# Patient Record
Sex: Male | Born: 1970 | Hispanic: No | Marital: Single | Smoking: Never smoker
Health system: Southern US, Community
[De-identification: ages and names within clinical notes are randomized; demographics above are authoritative.]

## PROBLEM LIST (undated history)

## (undated) DIAGNOSIS — E119 Type 2 diabetes mellitus without complications: Secondary | ICD-10-CM

## (undated) DIAGNOSIS — E785 Hyperlipidemia, unspecified: Secondary | ICD-10-CM

## (undated) HISTORY — DX: Hyperlipidemia, unspecified: E78.5

---

## 1998-04-02 ENCOUNTER — Encounter: Admission: RE | Admit: 1998-04-02 | Discharge: 1998-07-01 | Payer: Self-pay

## 2013-09-10 ENCOUNTER — Ambulatory Visit (INDEPENDENT_AMBULATORY_CARE_PROVIDER_SITE_OTHER): Payer: PRIVATE HEALTH INSURANCE | Admitting: Family Medicine

## 2013-09-10 ENCOUNTER — Encounter: Payer: Self-pay | Admitting: Family Medicine

## 2013-09-10 ENCOUNTER — Encounter (INDEPENDENT_AMBULATORY_CARE_PROVIDER_SITE_OTHER): Payer: Self-pay

## 2013-09-10 VITALS — BP 123/74 | HR 66 | Temp 98.4°F | Ht 65.5 in | Wt 151.8 lb

## 2013-09-10 DIAGNOSIS — Z0000 Encounter for general adult medical examination without abnormal findings: Secondary | ICD-10-CM

## 2013-09-10 LAB — POCT CBC
Granulocyte percent: 57.4 %G (ref 37–80)
HCT, POC: 43.7 % (ref 43.5–53.7)
Hemoglobin: 14.3 g/dL (ref 14.1–18.1)
Lymph, poc: 2.3 (ref 0.6–3.4)
MCH, POC: 27 pg (ref 27–31.2)
MCHC: 32.7 g/dL (ref 31.8–35.4)
MCV: 82.6 fL (ref 80–97)
MPV: 7.4 fL (ref 0–99.8)
POC Granulocyte: 3.3 (ref 2–6.9)
POC LYMPH PERCENT: 39.9 %L (ref 10–50)
Platelet Count, POC: 260 10*3/uL (ref 142–424)
RBC: 5.3 M/uL (ref 4.69–6.13)
RDW, POC: 13.8 %
WBC: 5.7 10*3/uL (ref 4.6–10.2)

## 2013-09-10 NOTE — Progress Notes (Signed)
   Subjective:    Patient ID: Raden Byington, male    DOB: 1970-10-14, 42 y.o.   MRN: [id number]  HPI This 42 y.o. male presents for evaluation of CPE.  He has no acute complaints.   Review of Systems    No chest pain, SOB, HA, dizziness, vision change, N/V, diarrhea, constipation, dysuria, urinary urgency or frequency, myalgias, arthralgias or rash.  Objective:   Physical Exam  Vital signs noted  Well developed well nourished male.  HEENT - Head atraumatic Normocephalic                Eyes - PERRLA, Conjuctiva - clear Sclera- Clear EOMI                Ears - EAC's Wnl TM's Wnl Gross Hearing WNL                Nose - Nares patent                 Throat - oropharanx wnl Respiratory - Lungs CTA bilateral Cardiac - RRR S1 and S2 without murmur GI - Abdomen soft Nontender and bowel sounds active x 4 Extremities - No edema. Neuro - Grossly intact.      Assessment & Plan:  Routine general medical examination at a health care facility - Plan: POCT CBC, CMP14+EGFR, Lipid panel, Thyroid Panel With TSH, PSA, total and free  Deatra Canter FNP

## 2013-09-11 LAB — PSA, TOTAL AND FREE
PSA, Free Pct: 25 %
PSA, Free: 0.2 ng/mL
PSA: 0.8 ng/mL (ref 0.0–4.0)

## 2013-09-11 LAB — CMP14+EGFR
ALT: 26 IU/L (ref 0–44)
AST: 29 IU/L (ref 0–40)
Albumin/Globulin Ratio: 1.9 (ref 1.1–2.5)
Albumin: 4.8 g/dL (ref 3.5–5.5)
Alkaline Phosphatase: 84 IU/L (ref 39–117)
BUN/Creatinine Ratio: 13 (ref 9–20)
BUN: 10 mg/dL (ref 6–24)
CO2: 23 mmol/L (ref 18–29)
Calcium: 9.3 mg/dL (ref 8.7–10.2)
Chloride: 100 mmol/L (ref 97–108)
Creatinine, Ser: 0.78 mg/dL (ref 0.76–1.27)
GFR calc Af Amer: 131 mL/min/{1.73_m2} (ref >59)
GFR calc non Af Amer: 113 mL/min/{1.73_m2} (ref >59)
Globulin, Total: 2.5 g/dL (ref 1.5–4.5)
Glucose: 92 mg/dL (ref 65–99)
Potassium: 4.2 mmol/L (ref 3.5–5.2)
Sodium: 142 mmol/L (ref 134–144)
Total Bilirubin: 0.4 mg/dL (ref 0.0–1.2)
Total Protein: 7.3 g/dL (ref 6.0–8.5)

## 2013-09-11 LAB — LIPID PANEL
Chol/HDL Ratio: 5 ratio units (ref 0.0–5.0)
Cholesterol, Total: 231 mg/dL — ABNORMAL HIGH (ref 100–199)
HDL: 46 mg/dL (ref >39)
LDL Calculated: 146 mg/dL — ABNORMAL HIGH (ref 0–99)
Triglycerides: 196 mg/dL — ABNORMAL HIGH (ref 0–149)
VLDL Cholesterol Cal: 39 mg/dL (ref 5–40)

## 2013-09-11 LAB — THYROID PANEL WITH TSH
Free Thyroxine Index: 2 (ref 1.2–4.9)
T3 Uptake Ratio: 28 % (ref 24–39)
T4, Total: 7 ug/dL (ref 4.5–12.0)
TSH: 1.07 u[IU]/mL (ref 0.450–4.500)

## 2013-12-12 ENCOUNTER — Encounter: Payer: Self-pay | Admitting: Family Medicine

## 2013-12-12 ENCOUNTER — Other Ambulatory Visit: Payer: Self-pay | Admitting: Family Medicine

## 2013-12-12 ENCOUNTER — Ambulatory Visit (INDEPENDENT_AMBULATORY_CARE_PROVIDER_SITE_OTHER): Payer: PRIVATE HEALTH INSURANCE | Admitting: Family Medicine

## 2013-12-12 VITALS — BP 124/71 | HR 62 | Temp 97.7°F | Ht 65.5 in | Wt 158.6 lb

## 2013-12-12 DIAGNOSIS — L851 Acquired keratosis [keratoderma] palmaris et plantaris: Secondary | ICD-10-CM

## 2013-12-12 DIAGNOSIS — L909 Atrophic disorder of skin, unspecified: Secondary | ICD-10-CM

## 2013-12-12 DIAGNOSIS — L919 Hypertrophic disorder of the skin, unspecified: Secondary | ICD-10-CM

## 2013-12-12 DIAGNOSIS — L918 Other hypertrophic disorders of the skin: Secondary | ICD-10-CM | POA: Insufficient documentation

## 2013-12-12 DIAGNOSIS — L570 Actinic keratosis: Secondary | ICD-10-CM | POA: Insufficient documentation

## 2013-12-12 NOTE — Progress Notes (Signed)
Patient ID: George SofiaWalter [name] Kane, male   DOB: Jan 31, 1971, 43 y.o.   MRN: [id number] SUBJECTIVE: CC: Chief Complaint  Patient presents with  . Acute Visit    WANTS SPOT CHECKED ON FOREHEAD STATES SAW BILL IN DEC FOR CPX AND SPOT THERE IN DEC BUT GOT BIGGER AFTER HE 'SQUEEZED IT"     HPI: Has a leion that was on the forehead that was treated with liquid N2 and it fell off and it is growing back and bigger,no h/o skin cancer.   No past medical history on file. No past surgical history on file. History   Social History  . Marital Status: Single    Spouse Name: N/A    Number of Children: N/A  . Years of Education: N/A   Occupational History  . Not on file.   Social History Main Topics  . Smoking status: Never Smoker   . Smokeless tobacco: Not on file  . Alcohol Use: No  . Drug Use: No  . Sexual Activity: Not on file   Other Topics Concern  . Not on file   Social History Narrative  . No narrative on file   Family History  Problem Relation Age of Onset  . Cancer Mother     breast  . Healthy Sister   . Healthy Brother    No current outpatient prescriptions on file prior to visit.   No current facility-administered medications on file prior to visit.   Allergies  Allergen Reactions  . Neosporin [Neomycin-Bacitracin Zn-Polymyx] Itching    There is no immunization history on file for this patient. Prior to Admission medications   Not on File     ROS: As above in the HPI. All other systems are stable or negative.  OBJECTIVE: APPEARANCE:  Patient in no acute distress.The patient appeared well nourished and normally developed. Acyanotic. Waist: VITAL SIGNS:BP 124/71  Pulse 62  Temp(Src) 97.7 F (36.5 C) (Oral)  Ht 5' 5.5" (1.664 m)  Wt 158 lb 9.6 oz (71.94 kg)  BMI 25.98 kg/m2   SKIN: warm and  Dry without overt rashes, tattoos and scars basae 2 mm keratotic horn on the center of the forehead.4 mm length. Skin tag in right axilla. Lymph Nodes:  normal.  HEAD and Neck: without JVD, Head and scalp: normal Eyes:No scleral icterus. Fundi normal, eye movements normal. Ears: Auricle normal, canal normal, Tympanic membranes normal, insufflation normal. Nose: normal Throat: normal Neck & thyroid: normal  CHEST & LUNGS: Chest wall: normal Lungs: Clear  CVS: Reveals the PMI to be normally located. Regular rhythm, First and Second Heart sounds are normal,  absence of murmurs, rubs or gallops. Peripheral vasculature: Radial pulses: normal Dorsal pedis pulses: normal Posterior pulses: normal  ABDOMEN:  Appearance: normal Benign, no organomegaly, no masses, no Abdominal Aortic enlargement. No Guarding , no rebound. No Bruits. Bowel sounds: normal  RECTAL: N/A GU: N/A  EXTREMETIES: nonedematous.  MUSCULOSKELETAL:  Spine: normal Joints: intact  NEUROLOGIC: oriented to time,place and person; nonfocal.   Procedure:  Area cleaned and prepped with betadine. 1cc of 2% lidocaine for LA Lesion shave off and sent to Path Silver nitrate used for hemostasis. Excellent response . No complication. bandaid applied  ASSESSMENT: Keratotic lesion  Cutaneous skin tags  PLAN: Wound care. Keep clean and dry for 24 hours before washing face .  Await path results in approximately 1 week. No orders of the defined types were placed in this encounter.   No orders of the defined types were placed  in this encounter.   There are no discontinued medications. Return if symptoms worsen or fail to improve.  Gustav Knueppel P. Modesto Charon, M.D.

## 2013-12-12 NOTE — Addendum Note (Signed)
Addended by: Prescott GumLAND, Anjanette Gilkey M on: 12/12/2013 05:10 PM   Modules accepted: Orders

## 2013-12-12 NOTE — Addendum Note (Signed)
Addended by: Prescott GumLAND, Alydia Gosser M on: 12/12/2013 05:09 PM   Modules accepted: Orders

## 2013-12-16 LAB — PATHOLOGY

## 2014-08-12 ENCOUNTER — Encounter (INDEPENDENT_AMBULATORY_CARE_PROVIDER_SITE_OTHER): Payer: Self-pay

## 2014-08-12 ENCOUNTER — Ambulatory Visit (INDEPENDENT_AMBULATORY_CARE_PROVIDER_SITE_OTHER): Payer: PRIVATE HEALTH INSURANCE | Admitting: Family Medicine

## 2014-08-12 ENCOUNTER — Encounter: Payer: Self-pay | Admitting: Family Medicine

## 2014-08-12 VITALS — BP 120/78 | HR 66 | Temp 99.3°F | Ht 65.5 in | Wt 155.8 lb

## 2014-08-12 DIAGNOSIS — Z0000 Encounter for general adult medical examination without abnormal findings: Secondary | ICD-10-CM | POA: Insufficient documentation

## 2014-08-12 NOTE — Progress Notes (Signed)
   Subjective:    Patient ID: George Kane, male    DOB: [birth year], 43 y.o.   MRN: [id number]  HPI  43 year old male here for annual physical at the request of his insurance carrier. He has no chronic problems and has no complaints or symptoms today.    Review of Systems  Constitutional: Negative.   HENT: Negative.   Eyes: Negative.   Respiratory: Negative.  Negative for shortness of breath.   Cardiovascular: Negative.  Negative for chest pain and leg swelling.  Gastrointestinal: Negative.   Genitourinary: Negative.   Musculoskeletal: Negative.   Skin: Negative.   Neurological: Negative.   Psychiatric/Behavioral: Negative.   All other systems reviewed and are negative.      Objective:   Physical Exam  Constitutional: He is oriented to person, place, and time. He appears well-developed and well-nourished.  HENT:  Head: Normocephalic.  Right Ear: External ear normal.  Left Ear: External ear normal.  Nose: Nose normal.  Mouth/Throat: Oropharynx is clear and moist.  Eyes: Conjunctivae and EOM are normal. Pupils are equal, round, and reactive to light.  Neck: Normal range of motion. Neck supple.  Cardiovascular: Normal rate, regular rhythm, normal heart sounds and intact distal pulses.   Pulmonary/Chest: Effort normal and breath sounds normal.  Abdominal: Soft. Bowel sounds are normal.  Musculoskeletal: Normal range of motion.  Neurological: He is alert and oriented to person, place, and time.  Skin: Skin is warm and dry.  Psychiatric: He has a normal mood and affect. His behavior is normal. Judgment and thought content normal.    BP 120/78 mmHg  Pulse 66  Temp(Src) 99.3 F (37.4 C) (Oral)  Ht 5' 5.5" (1.664 m)  Wt 155 lb 12.8 oz (70.67 kg)  BMI 25.52 kg/m2      Assessment & Plan:  1. Routine general medical examination at a health care facility Frederica KusterStephen M Aiven Kampe MD

## 2015-08-25 ENCOUNTER — Encounter: Payer: Self-pay | Admitting: Family Medicine

## 2015-08-25 ENCOUNTER — Ambulatory Visit (INDEPENDENT_AMBULATORY_CARE_PROVIDER_SITE_OTHER): Payer: BLUE CROSS/BLUE SHIELD | Admitting: Family Medicine

## 2015-08-25 VITALS — BP 125/73 | HR 57 | Temp 97.5°F | Ht 65.5 in | Wt 148.8 lb

## 2015-08-25 DIAGNOSIS — Z0000 Encounter for general adult medical examination without abnormal findings: Secondary | ICD-10-CM | POA: Diagnosis not present

## 2015-08-25 NOTE — Progress Notes (Signed)
   Subjective:    Patient ID: George Kane, male    DOB: June 07, 1971, 44 y.o.   MRN: [id number]  HPI 44 year old gentleman here for a annual physical at the request of his insurance company. He has lost some weight since his last visit 1 year ago by stopping drinking. But he is treated for drinking alcohol for sodas. He says it he notes that he urinates more frequently even at night probably this is related to the caffeine in the sodas but will check for diabetes with lab work. He has no other symptoms or complaints. We did spend some time talking about his native country [place] where I went several weeks ago on a mission trip.  Patient Active Problem List   Diagnosis Date Noted  . Routine general medical examination at a health care facility 08/12/2014  . Keratotic lesion 12/12/2013  . Cutaneous skin tags 12/12/2013   No outpatient encounter prescriptions on file as of 08/25/2015.   No facility-administered encounter medications on file as of 08/25/2015.      Review of Systems  Constitutional: Negative.   HENT: Negative.   Eyes: Negative.   Respiratory: Negative.  Negative for shortness of breath.   Cardiovascular: Negative.  Negative for chest pain and leg swelling.  Gastrointestinal: Negative.   Endocrine: Positive for polyuria.  Genitourinary: Negative.   Musculoskeletal: Negative.   Skin: Negative.   Neurological: Negative.   Psychiatric/Behavioral: Negative.   All other systems reviewed and are negative.      Objective:   Physical Exam  Constitutional: He is oriented to person, place, and time. He appears well-developed and well-nourished.  HENT:  Head: Normocephalic.  Right Ear: External ear normal.  Left Ear: External ear normal.  Nose: Nose normal.  Mouth/Throat: Oropharynx is clear and moist.  Eyes: Conjunctivae and EOM are normal. Pupils are equal, round, and reactive to light.  Neck: Normal range of motion. Neck supple.  Cardiovascular:  Normal rate, regular rhythm, normal heart sounds and intact distal pulses.   Pulmonary/Chest: Effort normal and breath sounds normal.  Abdominal: Soft. Bowel sounds are normal.  Musculoskeletal: Normal range of motion.  Neurological: He is alert and oriented to person, place, and time.  Skin: Skin is warm and dry.  Psychiatric: He has a normal mood and affect. His behavior is normal. Judgment and thought content normal.          Assessment & Plan:  1. Routine general medical examination at a health care facility Exam is normal. With symptom of polyuria will check for diabetes but this may be related to excess caffeine in sodas. - CMP14+EGFR - Lipid panel  Wardell Honour MD

## 2015-08-26 LAB — CMP14+EGFR
A/G RATIO: 1.7 (ref 1.1–2.5)
ALK PHOS: 97 IU/L (ref 39–117)
ALT: 41 IU/L (ref 0–44)
AST: 40 IU/L (ref 0–40)
Albumin: 4.8 g/dL (ref 3.5–5.5)
BILIRUBIN TOTAL: 0.4 mg/dL (ref 0.0–1.2)
BUN / CREAT RATIO: 12 (ref 9–20)
BUN: 11 mg/dL (ref 6–24)
CHLORIDE: 101 mmol/L (ref 97–106)
CO2: 27 mmol/L (ref 18–29)
Calcium: 9.7 mg/dL (ref 8.7–10.2)
Creatinine, Ser: 0.89 mg/dL (ref 0.76–1.27)
GFR calc non Af Amer: 105 mL/min/{1.73_m2} (ref >59)
GFR, EST AFRICAN AMERICAN: 122 mL/min/{1.73_m2} (ref >59)
GLOBULIN, TOTAL: 2.9 g/dL (ref 1.5–4.5)
GLUCOSE: 103 mg/dL — AB (ref 65–99)
Potassium: 4.3 mmol/L (ref 3.5–5.2)
SODIUM: 141 mmol/L (ref 136–144)
Total Protein: 7.7 g/dL (ref 6.0–8.5)

## 2015-08-26 LAB — LIPID PANEL
CHOLESTEROL TOTAL: 247 mg/dL — AB (ref 100–199)
Chol/HDL Ratio: 5.7 ratio units — ABNORMAL HIGH (ref 0.0–5.0)
HDL: 43 mg/dL (ref >39)
LDL Calculated: 152 mg/dL — ABNORMAL HIGH (ref 0–99)
Triglycerides: 262 mg/dL — ABNORMAL HIGH (ref 0–149)
VLDL Cholesterol Cal: 52 mg/dL — ABNORMAL HIGH (ref 5–40)

## 2016-08-31 NOTE — Progress Notes (Signed)
   Subjective:    Patient ID: George Kane, male    DOB: 1971-05-25, 45 y.o.   MRN: [id number]  HPI 45 year old gentleman here for annual physical at the request of his employer. He denies any problems or symptoms and he is generally healthy. Family history is not suggestive of the occurrence of any chronic problems.  Patient Active Problem List   Diagnosis Date Noted  . Routine general medical examination at a health care facility 08/12/2014  . Keratotic lesion 12/12/2013  . Cutaneous skin tags 12/12/2013   No outpatient encounter prescriptions on file as of 09/01/2016.   No facility-administered encounter medications on file as of 09/01/2016.       Review of Systems  Constitutional: Negative.   HENT: Negative.   Eyes: Negative.   Respiratory: Negative.  Negative for shortness of breath.   Cardiovascular: Negative.  Negative for chest pain and leg swelling.  Gastrointestinal: Negative.   Genitourinary: Negative.   Musculoskeletal: Negative.   Skin: Negative.   Neurological: Negative.   Psychiatric/Behavioral: Negative.   All other systems reviewed and are negative.      Objective:   Physical Exam  Constitutional: He is oriented to person, place, and time. He appears well-developed and well-nourished.  HENT:  Head: Normocephalic.  Mouth/Throat: Oropharynx is clear and moist.  Eyes: Pupils are equal, round, and reactive to light.  Neck: Normal range of motion.  Cardiovascular: Normal rate, regular rhythm and normal heart sounds.   Pulmonary/Chest: Effort normal and breath sounds normal.  Abdominal: Soft. There is no tenderness.  Genitourinary: Penis normal.  Neurological: He is alert and oriented to person, place, and time. He has normal reflexes.  Skin: Skin is warm and dry.  Psychiatric: He has a normal mood and affect. His behavior is normal.   BP 123/77   Pulse 61   Temp 97.3 F (36.3 C) (Oral)   Ht 5' 5.5" (1.664 m)   Wt 154 lb 3.2 oz (69.9 kg)   BMI  25.27 kg/m         Assessment & Plan:  1. Routine general medical examination at a health care facility M is normal. Patient has gained 5 pounds in the last year. Cautioned about continued weight gain. - CMP14+EGFR - Lipid panel  Wardell Honour MD

## 2016-09-01 ENCOUNTER — Encounter: Payer: Self-pay | Admitting: Family Medicine

## 2016-09-01 ENCOUNTER — Ambulatory Visit (INDEPENDENT_AMBULATORY_CARE_PROVIDER_SITE_OTHER): Payer: BLUE CROSS/BLUE SHIELD | Admitting: Family Medicine

## 2016-09-01 VITALS — BP 123/77 | HR 61 | Temp 97.3°F | Ht 65.5 in | Wt 154.2 lb

## 2016-09-01 DIAGNOSIS — Z0000 Encounter for general adult medical examination without abnormal findings: Secondary | ICD-10-CM

## 2016-09-02 LAB — CMP14+EGFR
A/G RATIO: 1.6 (ref 1.2–2.2)
ALT: 79 IU/L — ABNORMAL HIGH (ref 0–44)
AST: 53 IU/L — ABNORMAL HIGH (ref 0–40)
Albumin: 4.6 g/dL (ref 3.5–5.5)
Alkaline Phosphatase: 99 IU/L (ref 39–117)
BUN/Creatinine Ratio: 11 (ref 9–20)
BUN: 9 mg/dL (ref 6–24)
Bilirubin Total: 0.3 mg/dL (ref 0.0–1.2)
CO2: 27 mmol/L (ref 18–29)
Calcium: 9.2 mg/dL (ref 8.7–10.2)
Chloride: 98 mmol/L (ref 96–106)
Creatinine, Ser: 0.8 mg/dL (ref 0.76–1.27)
GFR, EST AFRICAN AMERICAN: 126 mL/min/{1.73_m2} (ref >59)
GFR, EST NON AFRICAN AMERICAN: 109 mL/min/{1.73_m2} (ref >59)
GLOBULIN, TOTAL: 2.9 g/dL (ref 1.5–4.5)
Glucose: 86 mg/dL (ref 65–99)
POTASSIUM: 4.7 mmol/L (ref 3.5–5.2)
SODIUM: 142 mmol/L (ref 134–144)
TOTAL PROTEIN: 7.5 g/dL (ref 6.0–8.5)

## 2016-09-02 LAB — LIPID PANEL
CHOL/HDL RATIO: 6 ratio — AB (ref 0.0–5.0)
Cholesterol, Total: 229 mg/dL — ABNORMAL HIGH (ref 100–199)
HDL: 38 mg/dL — ABNORMAL LOW (ref >39)
LDL CALC: 118 mg/dL — AB (ref 0–99)
Triglycerides: 363 mg/dL — ABNORMAL HIGH (ref 0–149)
VLDL Cholesterol Cal: 73 mg/dL — ABNORMAL HIGH (ref 5–40)

## 2017-06-28 ENCOUNTER — Encounter: Payer: Self-pay | Admitting: Family Medicine

## 2017-06-28 ENCOUNTER — Ambulatory Visit (INDEPENDENT_AMBULATORY_CARE_PROVIDER_SITE_OTHER): Payer: BLUE CROSS/BLUE SHIELD | Admitting: Family Medicine

## 2017-06-28 ENCOUNTER — Ambulatory Visit: Payer: BLUE CROSS/BLUE SHIELD | Admitting: Family Medicine

## 2017-06-28 ENCOUNTER — Ambulatory Visit (INDEPENDENT_AMBULATORY_CARE_PROVIDER_SITE_OTHER): Payer: BLUE CROSS/BLUE SHIELD

## 2017-06-28 VITALS — BP 126/76 | HR 65 | Temp 97.7°F | Ht 65.5 in | Wt 147.0 lb

## 2017-06-28 DIAGNOSIS — R1032 Left lower quadrant pain: Secondary | ICD-10-CM

## 2017-06-28 DIAGNOSIS — K5900 Constipation, unspecified: Secondary | ICD-10-CM | POA: Diagnosis not present

## 2017-06-28 LAB — URINALYSIS, COMPLETE
BILIRUBIN UA: NEGATIVE
Glucose, UA: NEGATIVE
Ketones, UA: NEGATIVE
Leukocytes, UA: NEGATIVE
NITRITE UA: NEGATIVE
PH UA: 6 (ref 5.0–7.5)
PROTEIN UA: NEGATIVE
Specific Gravity, UA: 1.02 (ref 1.005–1.030)
UUROB: 0.2 mg/dL (ref 0.2–1.0)

## 2017-06-28 LAB — MICROSCOPIC EXAMINATION
Bacteria, UA: NONE SEEN
Epithelial Cells (non renal): NONE SEEN /hpf (ref 0–10)
RENAL EPITHEL UA: NONE SEEN /HPF
WBC UA: NONE SEEN /HPF (ref 0–?)

## 2017-06-28 MED ORDER — POLYETHYLENE GLYCOL 3350 17 G PO PACK: 17.0000 g | PACK | Freq: Every day | ORAL | 0 refills | Status: DC

## 2017-06-28 NOTE — Progress Notes (Signed)
BP 126/76   Pulse 65   Temp 97.7 F (36.5 C) (Oral)   Ht 5' 5.5" (1.664 m)   Wt 147 lb (66.7 kg)   BMI 24.09 kg/m    Subjective:    Patient ID: George Kane, male    DOB: 17-Jun-1971, 46 y.o.   MRN: [id number]  HPI: George Kane is a 46 y.o. male presenting on 06/28/2017 for Abdominal Pain (LLQ x 1 week)   HPI Left lower quadrant abdominal pain Patient has been having left lower quadrant abdominal pain that has been going on for the past week. He does work at a job where he [employer] and heavy things frequently but he cannot recall any specific incident where he felt something Pain after lifting something. He denies any constipation or diarrhea or blood in the stool or dysuria or hematuria. He denies any fevers or chills or pain radiating anywhere else. He says it and pointed down in that left lower quadrant and it hurts more superficial in the abdominal wall or close to it. He denies any sick contacts that he knows of or nausea or vomiting. He says it did hurt for a couple nights about 4 days ago when he laid on that side but is better when he lays on the other side. It also hurts when he twists his torso to the right. He thinks the pain may come on worse after eating but he cannot recall specifically what it does come on and off throughout the day.  Relevant past medical, surgical, family and social history reviewed and updated as indicated. Interim medical history since our last visit reviewed. Allergies and medications reviewed and updated.  Review of Systems  Constitutional: Negative for chills and fever.  Eyes: Negative for discharge.  Respiratory: Negative for shortness of breath and wheezing.   Cardiovascular: Negative for chest pain and leg swelling.  Gastrointestinal: Positive for abdominal pain. Negative for constipation, diarrhea, nausea and vomiting.  Genitourinary: Negative for dysuria, flank pain, frequency and hematuria.  Musculoskeletal: Negative for  back pain and gait problem.  Skin: Negative for rash.  All other systems reviewed and are negative.   Per HPI unless specifically indicated above      Objective:    BP 126/76   Pulse 65   Temp 97.7 F (36.5 C) (Oral)   Ht 5' 5.5" (1.664 m)   Wt 147 lb (66.7 kg)   BMI 24.09 kg/m   Wt Readings from Last 3 Encounters:  06/28/17 147 lb (66.7 kg)  09/01/16 154 lb 3.2 oz (69.9 kg)  08/25/15 148 lb 12.8 oz (67.5 kg)    Physical Exam  Constitutional: He is oriented to person, place, and time. He appears well-developed and well-nourished. No distress.  Eyes: Conjunctivae are normal. No scleral icterus.  Cardiovascular: Normal rate, regular rhythm, normal heart sounds and intact distal pulses.   No murmur heard. Pulmonary/Chest: Effort normal and breath sounds normal. No respiratory distress. He has no wheezes. He has no rales.  Abdominal: Soft. Bowel sounds are normal. He exhibits no distension. There is no hepatosplenomegaly. There is tenderness in the left lower quadrant. There is no rigidity, no rebound, no guarding and no CVA tenderness.  Musculoskeletal: Normal range of motion. He exhibits no edema.  Neurological: He is alert and oriented to person, place, and time. Coordination normal.  Skin: Skin is warm and dry. No rash noted. He is not diaphoretic.  Psychiatric: He has a normal mood and affect. His behavior  is normal.  Nursing note and vitals reviewed.   Urinalysis: 0-2 RBCs, trace blood, otherwise normal  KUB: Moderate stool burden, and wait read from radiology    Assessment & Plan:   Problem List Items Addressed This Visit    None    Visit Diagnoses    Left lower quadrant abdominal pain of unknown etiology    -  Primary   Relevant Medications   polyethylene glycol (MIRALAX / GLYCOLAX) packet   Other Relevant Orders   Urinalysis, Complete   DG Abd 1 View   Constipation, unspecified constipation type       Relevant Medications   polyethylene glycol (MIRALAX /  GLYCOLAX) packet       Follow up plan: Return if symptoms worsen or fail to improve.  Counseling provided for all of the vaccine components Orders Placed This Encounter  Procedures  . DG Abd 1 View  . Urinalysis, Complete    Arville Care, MD [facility] 06/28/2017, 10:36 AM

## 2017-07-12 ENCOUNTER — Encounter: Payer: Self-pay | Admitting: Family Medicine

## 2017-07-12 ENCOUNTER — Ambulatory Visit (INDEPENDENT_AMBULATORY_CARE_PROVIDER_SITE_OTHER): Payer: BLUE CROSS/BLUE SHIELD | Admitting: Family Medicine

## 2017-07-12 ENCOUNTER — Encounter: Payer: BLUE CROSS/BLUE SHIELD | Admitting: Family Medicine

## 2017-07-12 VITALS — BP 137/84 | HR 69 | Temp 97.2°F | Ht 65.5 in | Wt 152.6 lb

## 2017-07-12 DIAGNOSIS — Z0000 Encounter for general adult medical examination without abnormal findings: Secondary | ICD-10-CM

## 2017-07-12 NOTE — Progress Notes (Signed)
   HPI  Patient presents today for annual physical exam.  He was seen a few weeks ago for left lower quadrant pain which was felt to be constipation possibly nephrolithiasis. We have reviewed imaging today and he understands that he does likely have a kidney stone, however it may not have been the issue. He states that his pain has almost completely resolved he has occasional left lower quadrant/left groin pain but states that it's much better.  He did improve after using laxatives.  Patient came in for fasting labs this morning.  He exercises by walking frequently. He does not watch his diet. He has no major concerns today  PMH: Smoking status noted ROS: Per HPI  Objective: BP 137/84   Pulse 69   Temp (!) 97.2 F (36.2 C) (Oral)   Ht 5' 5.5" (1.664 m)   Wt 152 lb 9.6 oz (69.2 kg)   BMI 25.01 kg/m  Gen: NAD, alert, cooperative with exam HEENT: NCAT, PERRLA, TMs normal bilaterally, oropharynx clear, nares with swollen turbinates bilaterally CV: RRR, good S1/S2, no murmur Resp: CTABL, no wheezes, non-labored Abd: SNTND, BS present, no guarding or organomegaly Ext: No edema, warm Neuro: Alert and oriented, No gross deficits  Assessment and plan:  # New physical exam Normal exam, BMI slightly up, however barely over 25. Labs, patient left fasting labs earlier. Discussed nephrolithiasis, offered urology referral, he will wait for now    Orders Placed This Encounter  Procedures  . CMP14+EGFR  . CBC with Differential/Platelet  . Lipid panel  . TSH     Laroy Apple, MD [facility] 07/12/2017, 2:36 PM

## 2017-07-12 NOTE — Patient Instructions (Signed)
Great to meet you!  You do have at least a single kidney stone on the L side, this measures 3mm X 6 mm.    Health Maintenance, Male A healthy lifestyle and preventive care is important for your health and wellness. Ask your health care provider about what schedule of regular examinations is right for you. What should I know about weight and diet? Eat a Healthy Diet  Eat plenty of vegetables, fruits, whole grains, low-fat dairy products, and lean protein.  Do not eat a lot of foods high in solid fats, added sugars, or salt.  Maintain a Healthy Weight Regular exercise can help you achieve or maintain a healthy weight. You should:  Do at least 150 minutes of exercise each week. The exercise should increase your heart rate and make you sweat (moderate-intensity exercise).  Do strength-training exercises at least twice a week.  Watch Your Levels of Cholesterol and Blood Lipids  Have your blood tested for lipids and cholesterol every 5 years starting at 44 years of age. If you are at high risk for heart disease, you should start having your blood tested when you are 44 years old. You may need to have your cholesterol levels checked more often if: ? Your lipid or cholesterol levels are high. ? You are older than 44 years of age. ? You are at high risk for heart disease.  What should I know about cancer screening? Many types of cancers can be detected early and may often be prevented. Lung Cancer  You should be screened every year for lung cancer if: ? You are a current smoker who has smoked for at least 30 years. ? You are a former smoker who has quit within the past 15 years.  Talk to your health care provider about your screening options, when you should start screening, and how often you should be screened.  Colorectal Cancer  Routine colorectal cancer screening usually begins at 44 years of age and should be repeated every 5-10 years until you are 44 years old. You may need to be  screened more often if early forms of precancerous polyps or small growths are found. Your health care provider may recommend screening at an earlier age if you have risk factors for colon cancer.  Your health care provider may recommend using home test kits to check for hidden blood in the stool.  A small camera at the end of a tube can be used to examine your colon (sigmoidoscopy or colonoscopy). This checks for the earliest forms of colorectal cancer.  Prostate and Testicular Cancer  Depending on your age and overall health, your health care provider may do certain tests to screen for prostate and testicular cancer.  Talk to your health care provider about any symptoms or concerns you have about testicular or prostate cancer.  Skin Cancer  Check your skin from head to toe regularly.  Tell your health care provider about any new moles or changes in moles, especially if: ? There is a change in a mole's size, shape, or color. ? You have a mole that is larger than a pencil eraser.  Always use sunscreen. Apply sunscreen liberally and repeat throughout the day.  Protect yourself by wearing long sleeves, pants, a wide-brimmed hat, and sunglasses when outside.  What should I know about heart disease, diabetes, and high blood pressure?  If you are 4218-44 years of age, have your blood pressure checked every 3-5 years. If you are 440 years of age or  older, have your blood pressure checked every year. You should have your blood pressure measured twice-once when you are at a hospital or clinic, and once when you are not at a hospital or clinic. Record the average of the two measurements. To check your blood pressure when you are not at a hospital or clinic, you can use: ? An automated blood pressure machine at a pharmacy. ? A home blood pressure monitor.  Talk to your health care provider about your target blood pressure.  If you are between 76-72 years old, ask your health care provider if you  should take aspirin to prevent heart disease.  Have regular diabetes screenings by checking your fasting blood sugar level. ? If you are at a normal weight and have a low risk for diabetes, have this test once every three years after the age of 95. ? If you are overweight and have a high risk for diabetes, consider being tested at a younger age or more often.  A one-time screening for abdominal aortic aneurysm (AAA) by ultrasound is recommended for men aged 65-75 years who are current or former smokers. What should I know about preventing infection? Hepatitis B If you have a higher risk for hepatitis B, you should be screened for this virus. Talk with your health care provider to find out if you are at risk for hepatitis B infection. Hepatitis C Blood testing is recommended for:  Everyone born from [birth year] through 1963.  Anyone with known risk factors for hepatitis C.  Sexually Transmitted Diseases (STDs)  You should be screened each year for STDs including gonorrhea and chlamydia if: ? You are sexually active and are younger than 44 years of age. ? You are older than 44 years of age and your health care provider tells you that you are at risk for this type of infection. ? Your sexual activity has changed since you were last screened and you are at an increased risk for chlamydia or gonorrhea. Ask your health care provider if you are at risk.  Talk with your health care provider about whether you are at high risk of being infected with HIV. Your health care provider may recommend a prescription medicine to help prevent HIV infection.  What else can I do?  Schedule regular health, dental, and eye exams.  Stay current with your vaccines (immunizations).  Do not use any tobacco products, such as cigarettes, chewing tobacco, and e-cigarettes. If you need help quitting, ask your health care provider.  Limit alcohol intake to no more than 2 drinks per day. One drink equals 12 ounces of beer,  5 ounces of wine, or 1 ounces of hard liquor.  Do not use street drugs.  Do not share needles.  Ask your health care provider for help if you need support or information about quitting drugs.  Tell your health care provider if you often feel depressed.  Tell your health care provider if you have ever been abused or do not feel safe at home. This information is not intended to replace advice given to you by your health care provider. Make sure you discuss any questions you have with your health care provider. Document Released: 03/09/2008 Document Revised: 05/10/2016 Document Reviewed: 06/15/2015 Elsevier Interactive Patient Education  [employer].

## 2017-07-13 ENCOUNTER — Other Ambulatory Visit: Payer: Self-pay | Admitting: Family Medicine

## 2017-07-13 LAB — CMP14+EGFR
A/G RATIO: 1.6 (ref 1.2–2.2)
ALBUMIN: 4.7 g/dL (ref 3.5–5.5)
ALT: 35 IU/L (ref 0–44)
AST: 30 IU/L (ref 0–40)
Alkaline Phosphatase: 91 IU/L (ref 39–117)
BILIRUBIN TOTAL: 0.2 mg/dL (ref 0.0–1.2)
BUN / CREAT RATIO: 15 (ref 9–20)
BUN: 13 mg/dL (ref 6–24)
CALCIUM: 9.5 mg/dL (ref 8.7–10.2)
CHLORIDE: 100 mmol/L (ref 96–106)
CO2: 25 mmol/L (ref 20–29)
Creatinine, Ser: 0.84 mg/dL (ref 0.76–1.27)
GFR, EST AFRICAN AMERICAN: 124 mL/min/{1.73_m2} (ref >59)
GFR, EST NON AFRICAN AMERICAN: 107 mL/min/{1.73_m2} (ref >59)
GLOBULIN, TOTAL: 3 g/dL (ref 1.5–4.5)
Glucose: 97 mg/dL (ref 65–99)
POTASSIUM: 4 mmol/L (ref 3.5–5.2)
SODIUM: 144 mmol/L (ref 134–144)
TOTAL PROTEIN: 7.7 g/dL (ref 6.0–8.5)

## 2017-07-13 LAB — LIPID PANEL
Chol/HDL Ratio: 5.1 ratio — ABNORMAL HIGH (ref 0.0–5.0)
Cholesterol, Total: 235 mg/dL — ABNORMAL HIGH (ref 100–199)
HDL: 46 mg/dL (ref >39)
TRIGLYCERIDES: 429 mg/dL — AB (ref 0–149)

## 2017-07-13 LAB — CBC WITH DIFFERENTIAL/PLATELET
BASOS: 0 %
Basophils Absolute: 0 10*3/uL (ref 0.0–0.2)
EOS (ABSOLUTE): 0.1 10*3/uL (ref 0.0–0.4)
Eos: 2 %
Hematocrit: 44.1 % (ref 37.5–51.0)
Hemoglobin: 15.2 g/dL (ref 13.0–17.7)
IMMATURE GRANS (ABS): 0 10*3/uL (ref 0.0–0.1)
IMMATURE GRANULOCYTES: 1 %
LYMPHS ABS: 2.7 10*3/uL (ref 0.7–3.1)
LYMPHS: 43 %
MCH: 28.2 pg (ref 26.6–33.0)
MCHC: 34.5 g/dL (ref 31.5–35.7)
MCV: 82 fL (ref 79–97)
Monocytes Absolute: 0.5 10*3/uL (ref 0.1–0.9)
Monocytes: 8 %
NEUTROS ABS: 3.1 10*3/uL (ref 1.4–7.0)
Neutrophils: 46 %
PLATELETS: 279 10*3/uL (ref 150–379)
RBC: 5.39 x10E6/uL (ref 4.14–5.80)
RDW: 15 % (ref 12.3–15.4)
WBC: 6.5 10*3/uL (ref 3.4–10.8)

## 2017-07-13 LAB — TSH: TSH: 1.64 u[IU]/mL (ref 0.450–4.500)

## 2017-07-13 MED ORDER — ICOSAPENT ETHYL 1 G PO CAPS: 2.0000 g | ORAL_CAPSULE | Freq: Two times a day (BID) | ORAL | 3 refills | Status: DC

## 2018-11-18 ENCOUNTER — Telehealth: Payer: Self-pay | Admitting: Family Medicine

## 2018-11-18 NOTE — Telephone Encounter (Signed)
Patient aware that he will need to be fasting for blood work

## 2018-12-06 ENCOUNTER — Ambulatory Visit (INDEPENDENT_AMBULATORY_CARE_PROVIDER_SITE_OTHER): Payer: Managed Care, Other (non HMO) | Admitting: Family Medicine

## 2018-12-06 ENCOUNTER — Encounter: Payer: Self-pay | Admitting: Family Medicine

## 2018-12-06 ENCOUNTER — Other Ambulatory Visit: Payer: Self-pay

## 2018-12-06 VITALS — BP 128/76 | HR 75 | Temp 98.1°F | Ht 65.5 in | Wt 158.2 lb

## 2018-12-06 DIAGNOSIS — L918 Other hypertrophic disorders of the skin: Secondary | ICD-10-CM

## 2018-12-06 DIAGNOSIS — M7731 Calcaneal spur, right foot: Secondary | ICD-10-CM | POA: Diagnosis not present

## 2018-12-06 DIAGNOSIS — Z0001 Encounter for general adult medical examination with abnormal findings: Secondary | ICD-10-CM

## 2018-12-06 DIAGNOSIS — L708 Other acne: Secondary | ICD-10-CM | POA: Diagnosis not present

## 2018-12-06 DIAGNOSIS — M778 Other enthesopathies, not elsewhere classified: Secondary | ICD-10-CM | POA: Diagnosis not present

## 2018-12-06 DIAGNOSIS — Z0000 Encounter for general adult medical examination without abnormal findings: Secondary | ICD-10-CM

## 2018-12-06 MED ORDER — DICLOFENAC SODIUM 75 MG PO TBEC: 75.0000 mg | DELAYED_RELEASE_TABLET | Freq: Two times a day (BID) | ORAL | 2 refills | Status: DC

## 2018-12-06 NOTE — Patient Instructions (Signed)
Use heel cups for support of feet to prevent pain.  Back brush in the shower

## 2018-12-06 NOTE — Progress Notes (Signed)
Subjective:  Patient ID: George Kane, male    DOB: 12-26-1970  Age: 48 y.o. MRN: [id number]  CC: Annual Exam   HPI Granvel Poch presents for annual exam.  Depression screen [facility] 2/9 12/06/2018 07/12/2017 06/28/2017  Decreased Interest 0 0 0  Down, Depressed, Hopeless 0 0 0  PHQ - 2 Score 0 0 0    History Rahil has no past medical history on file.   He has no past surgical history on file.   His family history includes Cancer in his mother; Healthy in his brother and sister.He reports that he has never smoked. He has never used smokeless tobacco. He reports that he does not drink alcohol or use drugs.    ROS Review of Systems  Constitutional: Negative for activity change, fatigue and unexpected weight change.  HENT: Negative for congestion, ear pain, hearing loss, postnasal drip and trouble swallowing.   Eyes: Negative for pain and visual disturbance.  Respiratory: Negative for cough, chest tightness and shortness of breath.   Cardiovascular: Negative for chest pain, palpitations and leg swelling.  Gastrointestinal: Negative for abdominal distention, abdominal pain, blood in stool, constipation, diarrhea, nausea and vomiting.  Endocrine: Negative for cold intolerance, heat intolerance and polydipsia.  Genitourinary: Negative for difficulty urinating, dysuria, flank pain, frequency and urgency.  Musculoskeletal: Positive for arthralgias (right elbow, right heel). Negative for joint swelling.  Skin: Negative for color change, rash and wound.  Neurological: Negative for dizziness, syncope, speech difficulty, weakness, light-headedness, numbness and headaches.  Hematological: Does not bruise/bleed easily.  Psychiatric/Behavioral: Negative for confusion, decreased concentration, dysphoric mood and sleep disturbance. The patient is not nervous/anxious.     Objective:  BP 128/76   Pulse 75   Temp 98.1 F (36.7 C) (Oral)   Ht 5' 5.5" (1.664 m)   Wt 158 lb 4 oz  (71.8 kg)   BMI 25.93 kg/m   BP Readings from Last 3 Encounters:  12/06/18 128/76  07/12/17 137/84  06/28/17 126/76    Wt Readings from Last 3 Encounters:  12/06/18 158 lb 4 oz (71.8 kg)  07/12/17 152 lb 9.6 oz (69.2 kg)  06/28/17 147 lb (66.7 kg)     Physical Exam Constitutional:      Appearance: He is well-developed.  HENT:     Head: Normocephalic and atraumatic.  Eyes:     Pupils: Pupils are equal, round, and reactive to light.  Neck:     Musculoskeletal: Normal range of motion.     Thyroid: No thyromegaly.     Trachea: No tracheal deviation.  Cardiovascular:     Rate and Rhythm: Normal rate and regular rhythm.     Heart sounds: Normal heart sounds. No murmur. No friction rub. No gallop.   Pulmonary:     Breath sounds: Normal breath sounds. No wheezing or rales.  Abdominal:     General: Bowel sounds are normal. There is no distension.     Palpations: Abdomen is soft. There is no mass.     Tenderness: There is no abdominal tenderness.     Hernia: There is no hernia in the right inguinal area or left inguinal area.  Genitourinary:    Penis: Normal.      Scrotum/Testes: Normal.  Musculoskeletal: Normal range of motion.        General: Tenderness (rightplantar calcaneus for deep palpation. Right olecranon insertion of triceps) present.  Lymphadenopathy:     Cervical: No cervical adenopathy.  Skin:    General: Skin is warm  and dry.     Findings: Lesion (moderate acne of the upper back) present.  Neurological:     Mental Status: He is alert and oriented to person, place, and time.       Assessment & Plan:   Olegario was seen today for annual exam.  Diagnoses and all orders for this visit:  Annual physical exam  Tendonitis of elbow, right  Heel spur, right  Other acne  Skin tag  Other orders -     diclofenac (VOLTAREN) 75 MG EC tablet; Take 1 tablet (75 mg total) by mouth 2 (two) times daily. For muscle and  Joint pain       I have discontinued  Abie Gustafsson. [name] "[name]"'s Icosapent Ethyl. I am also having him start on diclofenac.  Allergies as of 12/06/2018      Reactions   Neosporin [neomycin-bacitracin Zn-polymyx] Itching      Medication List       Accurate as of December 06, 2018  3:11 PM. Always use your most recent med list.        diclofenac 75 MG EC tablet Commonly known as:  VOLTAREN Take 1 tablet (75 mg total) by mouth 2 (two) times daily. For muscle and  Joint pain        Follow-up: Return in about 1 year (around 12/06/2019).  [name], M.D.

## 2018-12-06 NOTE — Addendum Note (Signed)
Addended by: Margurite Auerbach on: 12/06/2018 03:16 PM   Modules accepted: Orders

## 2018-12-07 ENCOUNTER — Other Ambulatory Visit: Payer: Managed Care, Other (non HMO)

## 2018-12-07 DIAGNOSIS — Z0000 Encounter for general adult medical examination without abnormal findings: Secondary | ICD-10-CM

## 2018-12-07 LAB — URINALYSIS
Bilirubin, UA: NEGATIVE
Glucose, UA: NEGATIVE
Ketones, UA: NEGATIVE
Leukocytes, UA: NEGATIVE
Nitrite, UA: NEGATIVE
PROTEIN UA: NEGATIVE
RBC, UA: NEGATIVE
Specific Gravity, UA: >1.03 — ABNORMAL HIGH (ref 1.005–1.030)
Urobilinogen, Ur: 0.2 mg/dL (ref 0.2–1.0)
pH, UA: 6 (ref 5.0–7.5)

## 2018-12-08 LAB — LIPID PANEL
CHOLESTEROL TOTAL: 233 mg/dL — AB (ref 100–199)
Chol/HDL Ratio: 6.9 ratio — ABNORMAL HIGH (ref 0.0–5.0)
HDL: 34 mg/dL — ABNORMAL LOW (ref >39)
Triglycerides: 501 mg/dL — ABNORMAL HIGH (ref 0–149)

## 2018-12-08 LAB — CMP14+EGFR
ALT: 55 IU/L — ABNORMAL HIGH (ref 0–44)
AST: 39 IU/L (ref 0–40)
Albumin/Globulin Ratio: 1.6 (ref 1.2–2.2)
Albumin: 4.5 g/dL (ref 4.0–5.0)
Alkaline Phosphatase: 95 IU/L (ref 39–117)
BUN/Creatinine Ratio: 16 (ref 9–20)
BUN: 16 mg/dL (ref 6–24)
Bilirubin Total: 0.3 mg/dL (ref 0.0–1.2)
CO2: 25 mmol/L (ref 20–29)
CREATININE: 0.99 mg/dL (ref 0.76–1.27)
Calcium: 9.6 mg/dL (ref 8.7–10.2)
Chloride: 104 mmol/L (ref 96–106)
GFR calc Af Amer: 106 mL/min/{1.73_m2} (ref >59)
GFR calc non Af Amer: 92 mL/min/{1.73_m2} (ref >59)
GLUCOSE: 124 mg/dL — AB (ref 65–99)
Globulin, Total: 2.9 g/dL (ref 1.5–4.5)
Potassium: 4 mmol/L (ref 3.5–5.2)
Sodium: 144 mmol/L (ref 134–144)
Total Protein: 7.4 g/dL (ref 6.0–8.5)

## 2018-12-08 LAB — CBC WITH DIFFERENTIAL/PLATELET
Basophils Absolute: 0 10*3/uL (ref 0.0–0.2)
Basos: 1 %
EOS (ABSOLUTE): 0.2 10*3/uL (ref 0.0–0.4)
Eos: 3 %
Hematocrit: 44.1 % (ref 37.5–51.0)
Hemoglobin: 15.1 g/dL (ref 13.0–17.7)
Immature Grans (Abs): 0 10*3/uL (ref 0.0–0.1)
Immature Granulocytes: 0 %
LYMPHS ABS: 2.7 10*3/uL (ref 0.7–3.1)
Lymphs: 44 %
MCH: 27.8 pg (ref 26.6–33.0)
MCHC: 34.2 g/dL (ref 31.5–35.7)
MCV: 81 fL (ref 79–97)
MONOS ABS: 0.4 10*3/uL (ref 0.1–0.9)
Monocytes: 7 %
NEUTROS ABS: 2.8 10*3/uL (ref 1.4–7.0)
Neutrophils: 45 %
Platelets: 311 10*3/uL (ref 150–450)
RBC: 5.44 x10E6/uL (ref 4.14–5.80)
RDW: 13.9 % (ref 11.6–15.4)
WBC: 6.2 10*3/uL (ref 3.4–10.8)

## 2018-12-08 LAB — PSA, TOTAL AND FREE
PSA, Free Pct: 20 %
PSA, Free: 0.14 ng/mL
Prostate Specific Ag, Serum: 0.7 ng/mL (ref 0.0–4.0)

## 2018-12-12 ENCOUNTER — Other Ambulatory Visit: Payer: Self-pay

## 2018-12-12 MED ORDER — ATORVASTATIN CALCIUM 40 MG PO TABS: 40.0000 mg | ORAL_TABLET | Freq: Every day | ORAL | 1 refills | Status: DC

## 2019-03-10 ENCOUNTER — Encounter (INDEPENDENT_AMBULATORY_CARE_PROVIDER_SITE_OTHER): Payer: Self-pay

## 2019-09-01 IMAGING — DX DG ABDOMEN 1V
1 series · 1 of 1 positions shown · non-contrast
Comparison: None in PACs

CLINICAL DATA: Left lower quadrant pain.

EXAM:
ABDOMEN - 1 VIEW

[abdomen kub]
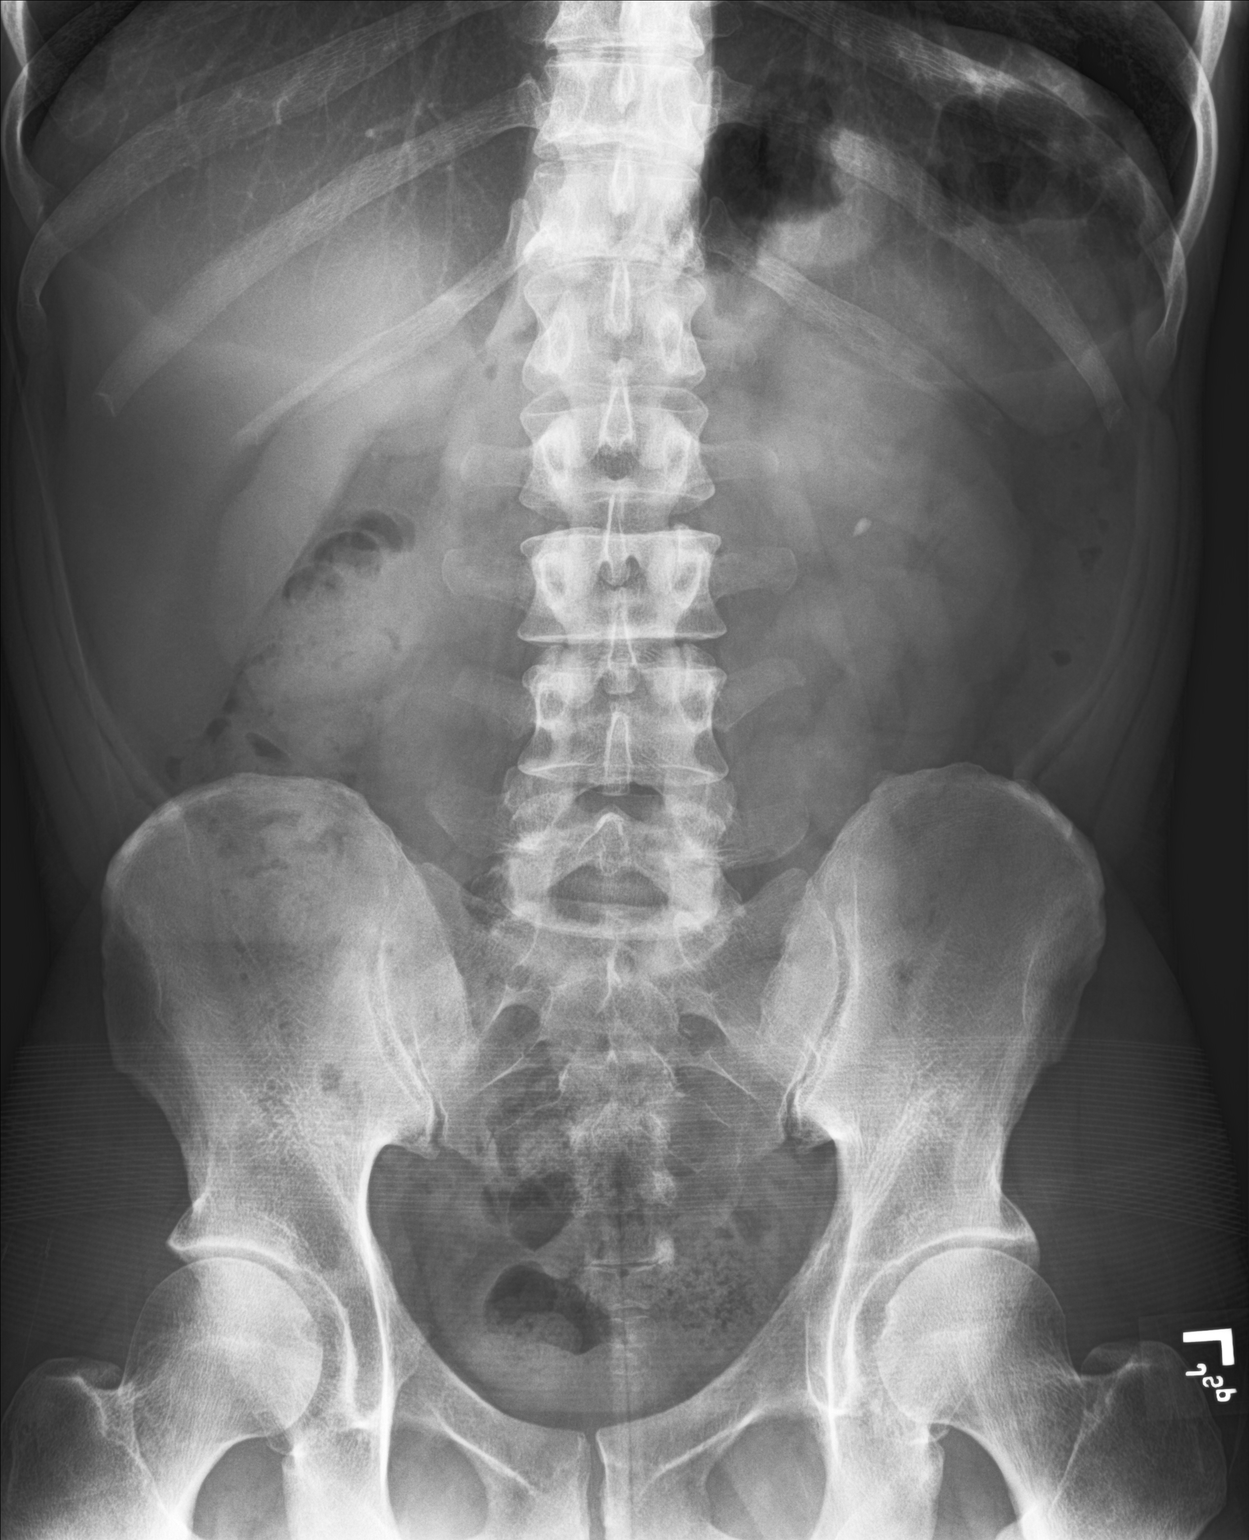

[1 of 1 positions shown; findings below may reference images not displayed]

FINDINGS: There is an approximately 3 x 6 mm stone projecting over the midpole
of the left kidney. There are faint calcifications measuring up 2 mm
in diameter projecting over the upper lower pole of the right
kidney. No definite ureteral stone is observed. There is a sclerotic
focus however just to the left of midline projecting over the lower
sacrum - upper coccyx that may reflect a large distal ureteral
stone.

The bowel gas pattern is normal. The bony structures are
unremarkable.
IMPRESSION: Bilateral kidney stones. Possible distal left ureteral stone but
this may reflect an overlying bony structure. Correlation with
patient's clinical exam and urinalysis is needed.

## 2020-09-29 ENCOUNTER — Other Ambulatory Visit: Payer: Self-pay

## 2020-09-29 ENCOUNTER — Ambulatory Visit (INDEPENDENT_AMBULATORY_CARE_PROVIDER_SITE_OTHER): Payer: 59 | Admitting: Family Medicine

## 2020-09-29 ENCOUNTER — Encounter: Payer: Self-pay | Admitting: Family Medicine

## 2020-09-29 VITALS — BP 134/89 | HR 65 | Temp 97.6°F | Ht 65.5 in | Wt 161.6 lb

## 2020-09-29 DIAGNOSIS — Z0001 Encounter for general adult medical examination with abnormal findings: Secondary | ICD-10-CM | POA: Diagnosis not present

## 2020-09-29 DIAGNOSIS — E782 Mixed hyperlipidemia: Secondary | ICD-10-CM | POA: Diagnosis not present

## 2020-09-29 DIAGNOSIS — Z0000 Encounter for general adult medical examination without abnormal findings: Secondary | ICD-10-CM

## 2020-09-29 DIAGNOSIS — R350 Frequency of micturition: Secondary | ICD-10-CM | POA: Diagnosis not present

## 2020-09-29 MED ORDER — ATORVASTATIN CALCIUM 40 MG PO TABS: 40.0000 mg | ORAL_TABLET | Freq: Every day | ORAL | 1 refills | Status: DC

## 2020-09-29 NOTE — Progress Notes (Signed)
Subjective:  Patient ID: George Kane, male    DOB: [birth year]  Age: 50 y.o. MRN: [id number]  CC: Annual Exam   George Kane presents for complete physical. His only concern is nocturia X 3 most nights. ONgoing for 1 year. Minimal help from George Kane after 7 PM  Depression screen [facility] 2/9 09/29/2020 12/06/2018 07/12/2017  Decreased Interest 0 0 0  Down, Depressed, Hopeless 0 0 0  PHQ - 2 Score 0 0 0    History George Kane has no past medical history on file.   He has no past surgical history on file.   His family history includes Cancer in his mother; Healthy in his brother and sister.He reports that he has never smoked. He has never used smokeless tobacco. He reports that he does not drink alcohol and does not use drugs.    ROS Review of Systems  Constitutional: Negative for activity change, fatigue and unexpected weight change.  HENT: Negative for congestion, ear pain, postnasal drip and trouble swallowing. Hearing loss: recent, both ears.   Eyes: Negative for pain and visual disturbance.  Respiratory: Negative for cough, chest tightness and shortness of breath.   Cardiovascular: Negative for chest pain, palpitations and leg swelling.  Gastrointestinal: Negative for abdominal distention, abdominal pain, blood in stool, constipation, diarrhea, nausea and vomiting.  Endocrine: Negative for cold intolerance, heat intolerance and polydipsia.  Genitourinary: Positive for frequency (nocturia X 3). Negative for decreased urine volume, difficulty urinating, dysuria, flank pain, penile pain, testicular pain and urgency.  Musculoskeletal: Negative for arthralgias and joint swelling.  Skin: Negative for color change, rash and wound.  Neurological: Negative for dizziness, syncope, speech difficulty, weakness, light-headedness, numbness and headaches.  Hematological: Does not bruise/bleed easily.  Psychiatric/Behavioral: Negative for confusion, decreased concentration, dysphoric  mood and sleep disturbance. The patient is not nervous/anxious.     Objective:  BP 134/89   Pulse 65   Temp 97.6 F (36.4 C) (Temporal)   Ht 5' 5.5" (1.664 m)   Wt 161 lb 9.6 oz (73.3 kg)   BMI 26.48 kg/m   BP Readings from Last 3 Encounters:  09/29/20 134/89  12/06/18 128/76  07/12/17 137/84    Wt Readings from Last 3 Encounters:  09/29/20 161 lb 9.6 oz (73.3 kg)  12/06/18 158 lb 4 oz (71.8 kg)  07/12/17 152 lb 9.6 oz (69.2 kg)     Physical Exam Constitutional:      Appearance: He is well-developed and well-nourished.  HENT:     Head: Normocephalic and atraumatic.     Mouth/Throat:     Mouth: Oropharynx is clear and moist.  Eyes:     Extraocular Movements: EOM normal.     Pupils: Pupils are equal, round, and reactive to light.  Neck:     Thyroid: No thyromegaly.     Trachea: No tracheal deviation.  Cardiovascular:     Rate and Rhythm: Normal rate and regular rhythm.     Heart sounds: Normal heart sounds. No murmur heard. No friction rub. No gallop.   Pulmonary:     Breath sounds: Normal breath sounds. No wheezing or rales.  Abdominal:     General: Bowel sounds are normal. There is no distension.     Palpations: Abdomen is soft. There is no mass.     Tenderness: There is no abdominal tenderness.     Hernia: There is no hernia in the right inguinal area or left inguinal area.  Genitourinary:    Penis: Normal.  Testes: Normal.     Prostate: Normal. Not enlarged, not tender and no nodules present.     Rectum: Normal.  Musculoskeletal:        General: No edema. Normal range of motion.     Cervical back: Normal range of motion.  Lymphadenopathy:     Cervical: No cervical adenopathy.  Skin:    General: Skin is warm and dry.  Neurological:     Mental Status: He is alert and oriented to person, place, and time.  Psychiatric:        Mood and Affect: Mood and affect normal.       Assessment & Plan:   George Kane was seen today for annual  exam.  Diagnoses and all orders for this visit:  Well adult exam -     Hepatitis C antibody -     HIV Antibody (routine testing w rflx) -     CBC with Differential/Platelet -     CMP14+EGFR -     Urinalysis -     Urine Culture -     VITAMIN D 25 Hydroxy (Vit-D Deficiency, Fractures) -     PSA, total and free -     Fecal occult blood, imunochemical; Future  Urinary frequency -     Hepatitis C antibody -     HIV Antibody (routine testing w rflx) -     CBC with Differential/Platelet -     CMP14+EGFR -     Urinalysis -     Urine Culture -     VITAMIN D 25 Hydroxy (Vit-D Deficiency, Fractures) -     PSA, total and free -     Fecal occult blood, imunochemical; Future  Mixed hyperlipidemia -     CMP14+EGFR -     Lipid panel  Other orders -     atorvastatin (LIPITOR) 40 MG tablet; Take 1 tablet (40 mg total) by mouth daily.       I have discontinued George Kane. George Kane "[name]"'s diclofenac. I am also having him maintain his atorvastatin.  Allergies as of 09/29/2020      Reactions   Neosporin [neomycin-bacitracin Zn-polymyx] Itching      Medication List       Accurate as of September 29, 2020 10:05 AM. If you have any questions, ask your nurse or doctor.        STOP taking these medications   diclofenac 75 MG EC tablet Commonly known as: VOLTAREN Stopped by: George Fraise, MD     TAKE these medications   atorvastatin 40 MG tablet Commonly known as: LIPITOR Take 1 tablet (40 mg total) by mouth daily.        Follow-up: Return in about 1 month (around 10/30/2020) for urinary frequency.  George Kane, M.D.

## 2020-09-29 NOTE — Addendum Note (Signed)
Addended by: Margorie John on: 09/29/2020 10:21 AM   Modules accepted: Orders

## 2020-09-30 ENCOUNTER — Other Ambulatory Visit: Payer: Self-pay | Admitting: Family Medicine

## 2020-09-30 LAB — HIV ANTIBODY (ROUTINE TESTING W REFLEX): HIV Screen 4th Generation wRfx: NONREACTIVE

## 2020-09-30 LAB — CMP14+EGFR
ALT: 100 IU/L — ABNORMAL HIGH (ref 0–44)
AST: 81 IU/L — ABNORMAL HIGH (ref 0–40)
Albumin/Globulin Ratio: 1.5 (ref 1.2–2.2)
Albumin: 4.7 g/dL (ref 4.0–5.0)
Alkaline Phosphatase: 116 IU/L (ref 44–121)
BUN/Creatinine Ratio: 12 (ref 9–20)
BUN: 10 mg/dL (ref 6–24)
Bilirubin Total: 0.5 mg/dL (ref 0.0–1.2)
CO2: 23 mmol/L (ref 20–29)
Calcium: 9.8 mg/dL (ref 8.7–10.2)
Chloride: 97 mmol/L (ref 96–106)
Creatinine, Ser: 0.85 mg/dL (ref 0.76–1.27)
GFR calc Af Amer: 120 mL/min/{1.73_m2} (ref >59)
GFR calc non Af Amer: 104 mL/min/{1.73_m2} (ref >59)
Globulin, Total: 3.1 g/dL (ref 1.5–4.5)
Glucose: 181 mg/dL — ABNORMAL HIGH (ref 65–99)
Potassium: 4 mmol/L (ref 3.5–5.2)
Sodium: 138 mmol/L (ref 134–144)
Total Protein: 7.8 g/dL (ref 6.0–8.5)

## 2020-09-30 LAB — LIPID PANEL
Chol/HDL Ratio: 7 ratio — ABNORMAL HIGH (ref 0.0–5.0)
Cholesterol, Total: 244 mg/dL — ABNORMAL HIGH (ref 100–199)
HDL: 35 mg/dL — ABNORMAL LOW (ref >39)
LDL Chol Calc (NIH): 118 mg/dL — ABNORMAL HIGH (ref 0–99)
Triglycerides: 512 mg/dL — ABNORMAL HIGH (ref 0–149)
VLDL Cholesterol Cal: 91 mg/dL — ABNORMAL HIGH (ref 5–40)

## 2020-09-30 LAB — CBC WITH DIFFERENTIAL/PLATELET
Basophils Absolute: 0 10*3/uL (ref 0.0–0.2)
Basos: 0 %
EOS (ABSOLUTE): 0.1 10*3/uL (ref 0.0–0.4)
Eos: 2 %
Hematocrit: 47.3 % (ref 37.5–51.0)
Hemoglobin: 15.7 g/dL (ref 13.0–17.7)
Immature Grans (Abs): 0 10*3/uL (ref 0.0–0.1)
Immature Granulocytes: 0 %
Lymphocytes Absolute: 1.9 10*3/uL (ref 0.7–3.1)
Lymphs: 39 %
MCH: 26.8 pg (ref 26.6–33.0)
MCHC: 33.2 g/dL (ref 31.5–35.7)
MCV: 81 fL (ref 79–97)
Monocytes Absolute: 0.4 10*3/uL (ref 0.1–0.9)
Monocytes: 8 %
Neutrophils Absolute: 2.5 10*3/uL (ref 1.4–7.0)
Neutrophils: 51 %
Platelets: 288 10*3/uL (ref 150–450)
RBC: 5.86 x10E6/uL — ABNORMAL HIGH (ref 4.14–5.80)
RDW: 13.4 % (ref 11.6–15.4)
WBC: 4.8 10*3/uL (ref 3.4–10.8)

## 2020-09-30 LAB — HEPATITIS C ANTIBODY: Hep C Virus Ab: <0.1 s/co ratio (ref 0.0–0.9)

## 2020-09-30 LAB — PSA, TOTAL AND FREE
PSA, Free Pct: 38.6 %
PSA, Free: 0.27 ng/mL
Prostate Specific Ag, Serum: 0.7 ng/mL (ref 0.0–4.0)

## 2020-09-30 LAB — VITAMIN D 25 HYDROXY (VIT D DEFICIENCY, FRACTURES): Vit D, 25-Hydroxy: 12.3 ng/mL — ABNORMAL LOW (ref 30.0–100.0)

## 2020-09-30 LAB — FECAL OCCULT BLOOD, IMMUNOCHEMICAL: Fecal Occult Bld: NEGATIVE

## 2020-09-30 MED ORDER — VITAMIN D (ERGOCALCIFEROL) 1.25 MG (50000 UNIT) PO CAPS: 50000.0000 [IU] | ORAL_CAPSULE | ORAL | 3 refills | Status: DC

## 2020-10-04 ENCOUNTER — Telehealth: Payer: Self-pay | Admitting: *Deleted

## 2020-10-04 LAB — SPECIMEN STATUS REPORT

## 2020-10-04 LAB — HGB A1C W/O EAG: Hgb A1c MFr Bld: 9.1 % — ABNORMAL HIGH (ref 4.8–5.6)

## 2020-10-04 NOTE — Telephone Encounter (Signed)
Signed physical insurance form placed at front desk for patient to pick up

## 2020-10-12 ENCOUNTER — Encounter: Payer: Self-pay | Admitting: *Deleted

## 2020-11-01 ENCOUNTER — Other Ambulatory Visit: Payer: Self-pay

## 2020-11-01 ENCOUNTER — Ambulatory Visit (INDEPENDENT_AMBULATORY_CARE_PROVIDER_SITE_OTHER): Payer: 59 | Admitting: Family Medicine

## 2020-11-01 ENCOUNTER — Encounter: Payer: Self-pay | Admitting: Family Medicine

## 2020-11-01 VITALS — BP 132/81 | HR 70 | Temp 98.4°F | Ht 65.5 in | Wt 158.2 lb

## 2020-11-01 DIAGNOSIS — R350 Frequency of micturition: Secondary | ICD-10-CM | POA: Diagnosis not present

## 2020-11-01 DIAGNOSIS — R81 Glycosuria: Secondary | ICD-10-CM

## 2020-11-01 DIAGNOSIS — E782 Mixed hyperlipidemia: Secondary | ICD-10-CM | POA: Diagnosis not present

## 2020-11-01 LAB — URINALYSIS, COMPLETE
Bilirubin, UA: NEGATIVE
Ketones, UA: NEGATIVE
Leukocytes,UA: NEGATIVE
Nitrite, UA: NEGATIVE
RBC, UA: NEGATIVE
Specific Gravity, UA: >1.03 — ABNORMAL HIGH (ref 1.005–1.030)
Urobilinogen, Ur: 0.2 mg/dL (ref 0.2–1.0)
pH, UA: 5 (ref 5.0–7.5)

## 2020-11-01 LAB — MICROSCOPIC EXAMINATION: RBC, Urine: NONE SEEN /hpf (ref 0–2)

## 2020-11-01 LAB — CMP14+EGFR
ALT: 61 IU/L — ABNORMAL HIGH (ref 0–44)
AST: 38 IU/L (ref 0–40)
Albumin/Globulin Ratio: 1.8 (ref 1.2–2.2)
Albumin: 4.9 g/dL (ref 4.0–5.0)
Alkaline Phosphatase: 141 IU/L — ABNORMAL HIGH (ref 44–121)
BUN/Creatinine Ratio: 14 (ref 9–20)
BUN: 13 mg/dL (ref 6–24)
Bilirubin Total: 0.6 mg/dL (ref 0.0–1.2)
CO2: 23 mmol/L (ref 20–29)
Calcium: 9.7 mg/dL (ref 8.7–10.2)
Chloride: 101 mmol/L (ref 96–106)
Creatinine, Ser: 0.92 mg/dL (ref 0.76–1.27)
GFR calc Af Amer: 114 mL/min/{1.73_m2} (ref >59)
GFR calc non Af Amer: 99 mL/min/{1.73_m2} (ref >59)
Globulin, Total: 2.8 g/dL (ref 1.5–4.5)
Glucose: 253 mg/dL — ABNORMAL HIGH (ref 65–99)
Potassium: 3.8 mmol/L (ref 3.5–5.2)
Sodium: 141 mmol/L (ref 134–144)
Total Protein: 7.7 g/dL (ref 6.0–8.5)

## 2020-11-01 LAB — LIPID PANEL
Chol/HDL Ratio: 4.1 ratio (ref 0.0–5.0)
Cholesterol, Total: 159 mg/dL (ref 100–199)
HDL: 39 mg/dL — ABNORMAL LOW (ref >39)
LDL Chol Calc (NIH): 71 mg/dL (ref 0–99)
Triglycerides: 303 mg/dL — ABNORMAL HIGH (ref 0–149)
VLDL Cholesterol Cal: 49 mg/dL — ABNORMAL HIGH (ref 5–40)

## 2020-11-01 NOTE — Progress Notes (Signed)
Subjective:  Patient ID: George Kane, male    DOB: 1971/08/29  Age: 50 y.o. MRN: [id number]  CC: Urinary Frequency   HPI George Kane presents for improved to the point of being in significant currently.  He is reduced his water intake after supper and eliminated all alcohol and the frequency of urination has diminished to nocturia x1 only and not present during the daytime.  Depression screen George Kane 2/9 11/01/2020 09/29/2020 12/06/2018  Decreased Interest 0 0 0  Down, Depressed, Hopeless 0 0 0  PHQ - 2 Score 0 0 0    History George Kane has no past medical history on file.   He has no past surgical history on file.   His family history includes Cancer in his mother; Healthy in his brother and sister.He reports that he has never smoked. He has never used smokeless tobacco. He reports that he does not drink alcohol and does not use drugs.    ROS Review of Systems  Constitutional: Negative for fever.  Respiratory: Negative for shortness of breath.   Cardiovascular: Negative for chest pain.  Musculoskeletal: Negative for arthralgias.  Skin: Negative for rash.    Objective:  BP 132/81   Pulse 70   Temp 98.4 F (36.9 C) (Temporal)   Ht 5' 5.5" (1.664 m)   Wt 158 lb 3.2 oz (71.8 kg)   BMI 25.93 kg/m   BP Readings from Last 3 Encounters:  11/01/20 132/81  09/29/20 134/89  12/06/18 128/76    Wt Readings from Last 3 Encounters:  11/01/20 158 lb 3.2 oz (71.8 kg)  09/29/20 161 lb 9.6 oz (73.3 kg)  12/06/18 158 lb 4 oz (71.8 kg)     Physical Exam Vitals reviewed.  Constitutional:      Appearance: He is well-developed and well-nourished.  HENT:     Head: Normocephalic and atraumatic.     Right Ear: External ear normal.     Left Ear: External ear normal.     Mouth/Throat:     Pharynx: No oropharyngeal exudate or posterior oropharyngeal erythema.  Eyes:     Pupils: Pupils are equal, round, and reactive to light.  Cardiovascular:     Rate and Rhythm: Normal  rate and regular rhythm.     Heart sounds: No murmur heard.   Pulmonary:     Effort: No respiratory distress.     Breath sounds: Normal breath sounds.  Musculoskeletal:     Cervical back: Normal range of motion and neck supple.  Neurological:     Mental Status: He is alert and oriented to person, place, and time.     Results for orders placed or performed in visit on 11/01/20  Microscopic Examination   Urine  Result Value Ref Range   WBC, UA 0-5 0 - 5 /hpf   RBC None seen 0 - 2 /hpf   Epithelial Cells (non renal) 0-10 0 - 10 /hpf   Mucus, UA Present (A) Not Estab.   Bacteria, UA Few None seen/Few  Urinalysis, Complete  Result Value Ref Range   Specific Gravity, UA >1.030 (H) 1.005 - 1.030   pH, UA 5.0 5.0 - 7.5   Color, UA Yellow Yellow   Appearance Ur Clear Clear   Leukocytes,UA Negative Negative   Protein,UA 1+ (A) Negative/Trace   Glucose, UA 3+ (A) Negative   Ketones, UA Negative Negative   RBC, UA Negative Negative   Bilirubin, UA Negative Negative   Urobilinogen, Ur 0.2 0.2 - 1.0 mg/dL  Nitrite, UA Negative Negative   Microscopic Examination See below:      Assessment & Plan:   Caymen was seen today for urinary frequency.  Diagnoses and all orders for this visit:  Mixed hyperlipidemia -     Lipid panel -     CMP14+EGFR  Urinary frequency -     Urine Culture; Future -     Urinalysis, Complete -     Urine Culture -     Microscopic Examination  Glycosuria -     Microscopic Examination    Glycosuria noted on specimen.  A1c will be added.   I am having George Kane. Sherrill Raring" maintain his atorvastatin and Vitamin D (Ergocalciferol).  Allergies as of 11/01/2020      Reactions   Neosporin [neomycin-bacitracin Zn-polymyx] Itching      Medication List       Accurate as of November 01, 2020  9:47 PM. If you have any questions, ask your nurse or doctor.        atorvastatin 40 MG tablet Commonly known as: LIPITOR Take 1 tablet (40 mg total) by  mouth daily.   Vitamin D (Ergocalciferol) 1.25 MG (50000 UNIT) Caps capsule Commonly known as: DRISDOL Take 1 capsule (50,000 Units total) by mouth every 7 (seven) days.        Follow-up: Return in about 6 months (around 05/01/2021), or if symptoms worsen or fail to improve.  Claretta Fraise, M.D.

## 2020-11-02 LAB — URINE CULTURE

## 2020-11-02 LAB — BAYER DCA HB A1C WAIVED: HB A1C (BAYER DCA - WAIVED): 10.3 % — ABNORMAL HIGH (ref <7.0)

## 2020-11-02 NOTE — Addendum Note (Signed)
Addended by: Margorie John on: 11/02/2020 08:07 AM   Modules accepted: Orders

## 2020-11-05 ENCOUNTER — Other Ambulatory Visit: Payer: Self-pay | Admitting: Family Medicine

## 2020-11-05 MED ORDER — METFORMIN HCL ER 500 MG PO TB24: 500.0000 mg | ORAL_TABLET | Freq: Every day | ORAL | 2 refills | Status: DC

## 2020-12-08 ENCOUNTER — Other Ambulatory Visit: Payer: Self-pay

## 2020-12-08 ENCOUNTER — Ambulatory Visit (INDEPENDENT_AMBULATORY_CARE_PROVIDER_SITE_OTHER): Payer: 59 | Admitting: Family Medicine

## 2020-12-08 ENCOUNTER — Encounter: Payer: Self-pay | Admitting: Family Medicine

## 2020-12-08 VITALS — BP 126/65 | HR 64 | Temp 98.0°F | Ht 65.5 in | Wt 152.8 lb

## 2020-12-08 DIAGNOSIS — E119 Type 2 diabetes mellitus without complications: Secondary | ICD-10-CM | POA: Diagnosis not present

## 2020-12-08 MED ORDER — BLOOD GLUCOSE MONITOR KIT: PACK | 11 refills | Status: AC

## 2020-12-08 NOTE — Patient Instructions (Signed)
https://www.diabeteseducator.org/docs/default-source/living-with-diabetes/conquering-the-grocery-store-v1.pdf?sfvrsn=4">  Recuento de carbohidratos para la diabetes mellitus en los adultos Carbohydrate Counting for Diabetes Mellitus, Adult El recuento de carbohidratos es un mtodo para llevar un registro de la cantidad de carbohidratos que se ingieren. La ingesta natural de carbohidratos aumenta la cantidad de azcar (glucosa) en la sangre. El recuento de la cantidad de carbohidratos que se ingieren mejora el control del nivel de [place], lo que ayuda a Company secretary la diabetes. Es importante saber la cantidad de carbohidratos que se pueden ingerir en cada comida sin correr Surveyor, minerals. Esto es Government social research officer. Un nutricionista puede ayudarlo a crear un plan de alimentacin y a calcular la cantidad de carbohidratos que debe ingerir en cada comida y colacin. Qu alimentos contienen carbohidratos? Los siguientes alimentos incluyen carbohidratos:  Granos, como panes y cereales.  Frijoles secos y productos con soja.  Verduras con almidn, como papas, guisantes y maz.  Nils Pyle y jugos de frutas.  Leche y Dentist.  Dulces y colaciones, como pasteles, galletas, caramelos, papas fritas de bolsa y refrescos.   Cmo se calculan los carbohidratos de los alimentos? Hay dos maneras de calcular los carbohidratos de los alimentos. Puede leer las etiquetas de los alimentos o aprender cules son los tamaos de las porciones estndar de los alimentos. Puede usar cualquiera de [address] o [place] combinacin de [place]. Usar la Air cabin crew de informacin nutricional La lista de informacin nutricional est incluida en las etiquetas de casi todas las bebidas y los alimentos envasados de los [place]. Incluye lo siguiente:  El tamao de la porcin.  Informacin sobre los nutrientes de cada porcin, incluidos los gramos (g) de carbohidratos por porcin. Para usar la informacin  nutricional:  Decida cuntas porciones va a comer.  Multiplique la cantidad de porciones por el nmero de carbohidratos por porcin.  El resultado es la cantidad total de carbohidratos que comer. Conocer los tamaos de las porciones estndar de los alimentos Cuando coma alimentos que contengan carbohidratos y que no estn envasados o no incluyan la informacin nutricional en la etiqueta, debe medir las porciones para poder calcular la cantidad de carbohidratos.  Mida los alimentos que comer con una balanza de alimentos o una taza medidora, si es necesario.  Decida cuntas porciones de Programmer, systems.  Multiplique el nmero de porciones por15. En los alimentos que contienen carbohidratos, una porcin [place] a 15 g de carbohidratos. ? Por ejemplo, si come 2 tazas o 10onzas (300g) de fresas, habr comido 2porciones y 30g de carbohidratos (2porciones x 15g=30g).  En el caso de las comidas que contienen mezclas de ms de un alimento, como las sopas y los guisos, debe calcular los carbohidratos de cada alimento que se incluye. La siguiente lista contiene los tamaos de porciones estndar de los alimentos ricos en carbohidratos ms comunes. Cada una de estas porciones tiene aproximadamente 15g de carbohidratos:  1rebanada de pan.  1tortilla de seis pulgadas (15cm).  ? de taza o 2onzas (53g) de arroz o pasta cocidos.   taza o 3 onzas (85 g) de lentejas o frijoles cocidos o enlatados, escurridos y enjuagados.   taza o 3onzas (85g) de verduras con almidn, como guisantes, maz o zapallo.   taza o 4 onzas (120 g) de cereal caliente.   taza o 3 onzas (85 g) de papas hervidas o en pur, o  o 3 onzas (85 g) de una papa grande al horno.   taza o 4 onzas fluidas ( ) de jugo de frutas.  1 taza u 8  onzas fluidas (237 ml) de leche.  1 unidad pequea o 4 onzas (106 g) de manzana.   unidad o 2 onzas (63 g) de una banana mediana.  1 taza o 5 oz (150 g) de  fresas.  3 tazas o 1 oz (24g) de palomitas de maz. Cul sera un ejemplo de recuento de carbohidratos? Para calcular el nmero de carbohidratos de este ejemplo de comida, siga los pasos que se describen a continuacin. Ejemplo de comida  3 onzas (85g) de pechugas de pollo.  ? de taza o 4 onzas (106 g) de arroz integral.   taza o 3 onzas (85 g) de maz.  1 taza u 8 onzas fluidas (237 ml) de leche.  1 taza o 5onzas (150g) de fresas con crema batida sin azcar. Clculo de carbohidratos 1. Identifique los alimentos que contienen carbohidratos: ? Arroz. ? Maz. ? Leche. ? Jinny Sanders. 2. Calcule cuntas porciones come de cada alimento: ? 2 porciones de arroz. ? 1 porcin de maz. ? 1 porcin de leche. ? 1 porcin de fresas. 3. Multiplique cada nmero de porciones por 15g: ? 2 porciones de arroz x 15 g = 30 g. ? 1 porcin de maz x 15 g = 15 g. ? 1 porcin de leche x 15 g = 15 g. ? 1 porcin de fresas x 15 g = 15 g. 4. Sume todas las cantidades para conocer el total de gramos de carbohidratos consumidos: ? 30g + 15g + 15g + 15g = 75g de carbohidratos en total. Consejos para seguir este plan Al ir de compras  [place]ore un plan de comidas y luego haga una lista de compras.  Compre verduras frescas y congeladas, frutas frescas y congeladas, productos lcteos, huevos, frijoles, lentejas y cereales integrales.  Fjese en las etiquetas de los alimentos. Elija los alimentos que tengan ms fibra y Neurosurgeon.  Evite los alimentos procesados y los alimentos con Biochemist, clinical. Planificacin de las comidas  Trate de consumir la misma cantidad de carbohidratos en cada comida y en cada colacin.  Planifique tomar comidas y colaciones regulares y equilibradas. Dnde buscar ms informacin  American Diabetes Association (Asociacin Estadounidense de la Diabetes): www.diabetes.org  Centers for Disease Control and Prevention (Centros para el Control y la Prevencin de  Event organiser): FootballExhibition.com.br Resumen  El recuento de carbohidratos es un mtodo para llevar un registro de la cantidad de carbohidratos que se ingieren.  La ingesta natural de carbohidratos aumenta la cantidad de azcar (glucosa) en la sangre.  El recuento de la cantidad de carbohidratos que se ingieren mejora el control del nivel de [place], lo que ayuda a Company secretary la diabetes.  Un nutricionista puede ayudarlo a crear un plan de alimentacin y a calcular la cantidad de carbohidratos que debe ingerir en cada comida y colacin. Esta informacin no tiene Theme park manager el consejo del mdico. Asegrese de hacerle al mdico cualquier pregunta que tenga. Document Revised: 10/20/2019 Document Reviewed: 10/20/2019 Elsevier Patient Education  2021 Elsevier Inc. Diabetes Mellitus and Exercise Exercising regularly is important for overall health, especially for people who have diabetes mellitus. Exercising is not only about losing weight. It has many other health benefits, such as increasing muscle strength and bone density and reducing body fat and stress. This leads to improved fitness, flexibility, and endurance, all of which result in better overall health. What are the benefits of exercise if I have diabetes? Exercise has many benefits for people with diabetes. They include:  Helping to lower and control blood sugar (  glucose).  Helping the body to respond better to the hormone insulin by improving insulin sensitivity.  Reducing how much insulin the body needs.  Lowering the risk for heart disease by: ? Lowering "bad" cholesterol and triglyceride levels. ? Increasing "good" cholesterol levels. ? Lowering blood pressure. ? Lowering blood glucose levels. What is my activity plan? Your health care provider or certified diabetes educator can help you make a plan for the type and frequency of exercise that works for you. This is called your activity plan. Be sure to:  Get at least 150  minutes of medium-intensity or high-intensity exercise each week. Exercises may include brisk walking, biking, or water aerobics.  Do stretching and strengthening exercises, such as yoga or weight lifting, at least 2 times a week.  Spread out your activity over at least 3 days of the week.  Get some form of physical activity each day. ? Do not go more than 2 days in a row without some kind of physical activity. ? Avoid being inactive for more than 90 minutes at a time. Take frequent breaks to walk or stretch.  Choose exercises or activities that you enjoy. Set realistic goals.  Start slowly and gradually increase your exercise intensity over time.   How do I manage my diabetes during exercise? Monitor your blood glucose  Check your blood glucose before and after exercising. If your blood glucose is: ? 240 mg/dL (66.0 mmol/L) or higher before you exercise, check your urine for ketones. These are chemicals created by the liver. If you have ketones in your urine, do not exercise until your blood glucose returns to normal. ? 100 mg/dL (5.6 mmol/L) or lower, eat a snack containing 15-20 grams of carbohydrate. Check your blood glucose 15 minutes after the snack to make sure that your glucose level is above 100 mg/dL (5.6 mmol/L) before you start your exercise.  Know the symptoms of low blood glucose (hypoglycemia) and how to treat it. Your risk for hypoglycemia increases during and after exercise. Follow these tips and your health care provider's instructions  Keep a carbohydrate snack that is fast-acting for use before, during, and after exercise to help prevent or treat hypoglycemia.  Avoid injecting insulin into areas of the body that are going to be exercised. For example, avoid injecting insulin into: ? Your arms, when you are about to play tennis. ? Your legs, when you are about to go jogging.  Keep records of your exercise habits. Doing this can help you and your health care provider  adjust your diabetes management plan as needed. Write down: ? Food that you eat before and after you exercise. ? Blood glucose levels before and after you exercise. ? The type and amount of exercise you have done.  Work with your health care provider when you start a new exercise or activity. He or she may need to: ? Make sure that the activity is safe for you. ? Adjust your insulin, other medicines, and food that you eat.  Drink plenty of water while you exercise. This prevents loss of water (dehydration) and problems caused by a lot of heat in the body (heat stroke).   Where to find more information  American Diabetes Association: www.diabetes.org Summary  Exercising regularly is important for overall health, especially for people who have diabetes mellitus.  Exercising has many health benefits. It increases muscle strength and bone density and reduces body fat and stress. It also lowers and controls blood glucose.  Your health care provider  or certified diabetes educator can help you make an activity plan for the type and frequency of exercise that works for you.  Work with your health care provider to make sure any new activity is safe for you. Also work with your health care provider to adjust your insulin, other medicines, and the food you eat. This information is not intended to replace advice given to you by your health care provider. Make sure you discuss any questions you have with your health care provider. Document Revised: 06/09/2019 Document Reviewed: 06/09/2019 Elsevier Patient Education  2021 Elsevier Inc. https://www.diabeteseducator.org/docs/default-source/living-with-diabetes/conquering-the-grocery-store-v1.pdf?sfvrsn=4">  Carbohydrate Counting for Diabetes Mellitus, Adult Carbohydrate counting is a method of keeping track of how many carbohydrates you eat. Eating carbohydrates naturally increases the amount of sugar (glucose) in the blood. Counting how many  carbohydrates you eat improves your blood glucose control, which helps you manage your diabetes. It is important to know how many carbohydrates you can safely have in each meal. This is different for every person. A dietitian can help you make a meal plan and calculate how many carbohydrates you should have at each meal and snack. What foods contain carbohydrates? Carbohydrates are found in the following foods:  Grains, such as breads and cereals.  Dried beans and soy products.  Starchy vegetables, such as potatoes, peas, and corn.  Fruit and fruit juices.  Milk and yogurt.  Sweets and snack foods, such as cake, cookies, candy, chips, and soft drinks.   How do I count carbohydrates in foods? There are two ways to count carbohydrates in food. You can read food labels or learn standard serving sizes of foods. You can use either of the methods or a combination of both. Using the Nutrition Facts label The Nutrition Facts list is included on the labels of almost all packaged foods and beverages in the [place] It includes:  The serving size.  Information about nutrients in each serving, including the grams (g) of carbohydrate per serving. To use the Nutrition Facts:  Decide how many servings you will have.  Multiply the number of servings by the number of carbohydrates per serving.  The resulting number is the total amount of carbohydrates that you will be having. Learning the standard serving sizes of foods When you eat carbohydrate foods that are not packaged or do not include Nutrition Facts on the label, you need to measure the servings in order to count the amount of carbohydrates.  Measure the foods that you will eat with a food scale or measuring cup, if needed.  Decide how many standard-size servings you will eat.  Multiply the number of servings by 15. For foods that contain carbohydrates, one serving equals 15 g of carbohydrates. ? For example, if you eat 2 cups or 10 oz (300  g) of strawberries, you will have eaten 2 servings and 30 g of carbohydrates (2 servings x 15 g = 30 g).  For foods that have more than one food mixed, such as soups and casseroles, you must count the carbohydrates in each food that is included. The following list contains standard serving sizes of common carbohydrate-rich foods. Each of these servings has about 15 g of carbohydrates:  1 slice of bread.  1 six-inch (15 cm) tortilla.  ? cup or 2 oz (53 g) cooked rice or pasta.   cup or 3 oz (85 g) cooked or canned, drained and rinsed beans or lentils.   cup or 3 oz (85 g) starchy vegetable, such as peas, corn, or  squash.   cup or 4 oz (120 g) hot cereal.   cup or 3 oz (85 g) boiled or mashed potatoes, or  or 3 oz (85 g) of a large baked potato.   cup or 4 fl oz (118 mL) fruit juice.  1 cup or 8 fl oz (237 mL) milk.  1 small or 4 oz (106 g) apple.   or 2 oz (63 g) of a medium banana.  1 cup or 5 oz (150 g) strawberries.  3 cups or 1 oz (24 g) popped popcorn. What is an example of carbohydrate counting? To calculate the number of carbohydrates in this sample meal, follow the steps shown below. Sample meal  3 oz (85 g) chicken breast.  ? cup or 4 oz (106 g) brown rice.   cup or 3 oz (85 g) corn.  1 cup or 8 fl oz (237 mL) milk.  1 cup or 5 oz (150 g) strawberries with sugar-free whipped topping. Carbohydrate calculation 1. Identify the foods that contain carbohydrates: ? Rice. ? Corn. ? Milk. ? Strawberries. 2. Calculate how many servings you have of each food: ? 2 servings rice. ? 1 serving corn. ? 1 serving milk. ? 1 serving strawberries. 3. Multiply each number of servings by 15 g: ? 2 servings rice x 15 g = 30 g. ? 1 serving corn x 15 g = 15 g. ? 1 serving milk x 15 g = 15 g. ? 1 serving strawberries x 15 g = 15 g. 4. Add together all of the amounts to find the total grams of carbohydrates eaten: ? 30 g + 15 g + 15 g + 15 g = 75 g of carbohydrates  total. What are tips for following this plan? Shopping  Develop a meal plan and then make a shopping list.  Buy fresh and frozen vegetables, fresh and frozen fruit, dairy, eggs, beans, lentils, and whole grains.  Look at food labels. Choose foods that have more fiber and less sugar.  Avoid processed foods and foods with added sugars. Meal planning  Aim to have the same amount of carbohydrates at each meal and for each snack time.  Plan to have regular, balanced meals and snacks. Where to find more information  American Diabetes Association: www.diabetes.org  Centers for Disease Control and Prevention: FootballExhibition.com.br Summary  Carbohydrate counting is a method of keeping track of how many carbohydrates you eat.  Eating carbohydrates naturally increases the amount of sugar (glucose) in the blood.  Counting how many carbohydrates you eat improves your blood glucose control, which helps you manage your diabetes.  A dietitian can help you make a meal plan and calculate how many carbohydrates you should have at each meal and snack. This information is not intended to replace advice given to you by your health care provider. Make sure you discuss any questions you have with your health care provider. Document Revised: 09/11/2019 Document Reviewed: 09/12/2019 Elsevier Patient Education  2021 [employer].

## 2020-12-08 NOTE — Progress Notes (Signed)
Subjective:  Patient ID: George Kane, male    DOB: 05-28-1971  Age: 50 y.o. MRN: [id number]  CC: Diabetes   HPI George Kane presents for consultation and orientation regarding his new diagnosis of diabetes.  Mr. Cardarelli was seen for routine physical last month and noted that he was having a lot of polyuria and thirst as well as excessive hunger.  His blood sugar was found to be elevated and a confirmation A1c was sent which came back showing the presence of diabetes at 9.1.  Today he is in for phase 1 of treatment.  Due to the elevation of the A1c and symptoms he had been started on Metformin.  He continues to take that.  Today he is here for discussion of diet exercise and glucose monitoring.  The 2 types of pathophysiology of type I and type 2 diabetes were explained.  And his questions were answered.  He understands the more common form of diabetes at his age is type II.  However with his weight being near its ideal, there is a possibility of type 1 diabetes.  Therefore C-peptide will be performed as part of today's evaluation.  He was given handouts for carb counting for diabetes as well as for exercise and diabetes.  Detailed discussion including the need for aerobic and resistance training accompanied discussion of the basic carbohydrates that he needed to look at and moderate.  He should avoid desserts and sweets etc.  He should avoid sodas.  He says he does not really care for either of these groups of foods.  However, he was cautioned against them simply further carbohydrate content.  These should be rare treats if he chooses to consume them.  We also reviewed starches.  Bread rice corn and potatoes were explained is the basic starches in the usual diet.  I also did explain to him that some types of beans could be rather high in carb.  If he is to consume starches he needs to look for starches that have more complex carbohydrates such as sweet potatoes and whole-grain foods.  He  was given a prescription for a glucose monitor and strips.  He will return for training in order to see if his pharmacist can help him.  He was given a blank glucose log.  I explained to him how to measure his sugar both fasting in the morning and postprandial 2 hours after one of the larger meals of the day.  Particularly the one that would contain more carbohydrate.  Depression screen [facility] 2/9 12/08/2020 11/01/2020 09/29/2020  Decreased Interest 0 0 0  Down, Depressed, Hopeless 0 0 0  PHQ - 2 Score 0 0 0    History George Kane has no past medical history on file.   He has no past surgical history on file.   His family history includes Cancer in his mother; Healthy in his brother and sister.He reports that he has never smoked. He has never used smokeless tobacco. He reports that he does not drink alcohol and does not use drugs.    ROS Review of Systems  Objective:  BP 126/65   Pulse 64   Temp 98 F (36.7 C)   Ht 5' 5.5" (1.664 m)   Wt 152 lb 12.8 oz (69.3 kg)   SpO2 97%   BMI 25.04 kg/m   BP Readings from Last 3 Encounters:  12/08/20 126/65  11/01/20 132/81  09/29/20 134/89    Wt Readings from Last 3 Encounters:  12/08/20  152 lb 12.8 oz (69.3 kg)  11/01/20 158 lb 3.2 oz (71.8 kg)  09/29/20 161 lb 9.6 oz (73.3 kg)     Physical Exam Vitals reviewed.  Constitutional:      Appearance: He is well-developed.  HENT:     Head: Normocephalic and atraumatic.     Right Ear: External ear normal.     Left Ear: External ear normal.     Mouth/Throat:     Pharynx: No oropharyngeal exudate or posterior oropharyngeal erythema.  Eyes:     Pupils: Pupils are equal, round, and reactive to light.  Cardiovascular:     Rate and Rhythm: Normal rate and regular rhythm.     Heart sounds: No murmur heard.   Pulmonary:     Effort: No respiratory distress.     Breath sounds: Normal breath sounds.  Musculoskeletal:     Cervical back: Normal range of motion and neck supple.  Neurological:      Mental Status: He is alert and oriented to person, place, and time.       Assessment & Plan:   Brison was seen today for diabetes.  Diagnoses and all orders for this visit:  Diabetes mellitus without complication ([place]) -     CMP14+EGFR -     C-peptide -     TSH -     Ambulatory referral to diabetic education  Other orders -     blood glucose meter kit and supplies KIT; Dispense based on patient and insurance preference. Use up to four times daily as directed. (FOR ICD-10 : E11.9       I am having Peretz Thieme. Sherrill Raring" start on blood glucose meter kit and supplies. I am also having him maintain his atorvastatin, Vitamin D (Ergocalciferol), and metFORMIN.  Allergies as of 12/08/2020      Reactions   Neosporin [neomycin-bacitracin Zn-polymyx] Itching      Medication List       Accurate as of December 08, 2020  8:32 PM. If you have any questions, ask your nurse or doctor.        atorvastatin 40 MG tablet Commonly known as: LIPITOR Take 1 tablet (40 mg total) by mouth daily.   blood glucose meter kit and supplies Kit Dispense based on patient and insurance preference. Use up to four times daily as directed. (FOR ICD-10 : E11.9 Started by: Claretta Fraise, MD   metFORMIN 500 MG 24 hr tablet Commonly known as: GLUCOPHAGE-XR Take 1 tablet (500 mg total) by mouth daily with breakfast.   Vitamin D (Ergocalciferol) 1.25 MG (50000 UNIT) Caps capsule Commonly known as: DRISDOL Take 1 capsule (50,000 Units total) by mouth every 7 (seven) days.      Over 40 minutes was spent with the patient.  More than half of this in discussion of diabetes and the different aspects of treatment that would be used for him.  Follow-up: Return in about 1 month (around 01/08/2021) for diabetes.  Claretta Fraise, M.D.

## 2020-12-09 ENCOUNTER — Other Ambulatory Visit: Payer: Self-pay | Admitting: Family Medicine

## 2020-12-09 LAB — CMP14+EGFR
ALT: 57 IU/L — ABNORMAL HIGH (ref 0–44)
AST: 45 IU/L — ABNORMAL HIGH (ref 0–40)
Albumin/Globulin Ratio: 1.7 (ref 1.2–2.2)
Albumin: 5 g/dL (ref 4.0–5.0)
Alkaline Phosphatase: 148 IU/L — ABNORMAL HIGH (ref 44–121)
BUN/Creatinine Ratio: 12 (ref 9–20)
BUN: 10 mg/dL (ref 6–24)
Bilirubin Total: 0.6 mg/dL (ref 0.0–1.2)
CO2: 22 mmol/L (ref 20–29)
Calcium: 10.6 mg/dL — ABNORMAL HIGH (ref 8.7–10.2)
Chloride: 97 mmol/L (ref 96–106)
Creatinine, Ser: 0.84 mg/dL (ref 0.76–1.27)
Globulin, Total: 2.9 g/dL (ref 1.5–4.5)
Glucose: 311 mg/dL — ABNORMAL HIGH (ref 65–99)
Potassium: 4.1 mmol/L (ref 3.5–5.2)
Sodium: 139 mmol/L (ref 134–144)
Total Protein: 7.9 g/dL (ref 6.0–8.5)
eGFR: 108 mL/min/{1.73_m2} (ref >59)

## 2020-12-09 LAB — TSH: TSH: 1.6 u[IU]/mL (ref 0.450–4.500)

## 2020-12-09 LAB — C-PEPTIDE: C-Peptide: 3.2 ng/mL (ref 1.1–4.4)

## 2020-12-09 MED ORDER — METFORMIN HCL ER 500 MG PO TB24: 500.0000 mg | ORAL_TABLET | Freq: Two times a day (BID) | ORAL | 2 refills | Status: DC

## 2020-12-17 ENCOUNTER — Telehealth: Payer: Self-pay | Admitting: Family Medicine

## 2020-12-17 NOTE — Telephone Encounter (Signed)
Pt is taking lipitor, stated that he was told that he needed to increase to taking two a day. Unable to find documentation in pt's chart.   Pt stated he would like to use patches rather than glucose meter.

## 2020-12-20 ENCOUNTER — Other Ambulatory Visit: Payer: Self-pay | Admitting: Family Medicine

## 2020-12-20 MED ORDER — ATORVASTATIN CALCIUM 80 MG PO TABS: 80.0000 mg | ORAL_TABLET | Freq: Every day | ORAL | 1 refills | Status: DC

## 2020-12-20 NOTE — Telephone Encounter (Signed)
HE is correct about the lipitor/atorvastatin. I went ahead just now and sent in the double strength pill, so one pill will be enough going forward. This is because his triglycerides were over 300 when checked last month.  He also requested the patch for his glucose readings for DM. Unfortunately they are only covered by insurance if he takes multiple daily doses of insulin. He doesn't qualify as a result.  However, he can buy it without insurance and use his HSA if he has one.

## 2020-12-20 NOTE — Telephone Encounter (Signed)
Patient aware.

## 2020-12-24 ENCOUNTER — Other Ambulatory Visit: Payer: Self-pay | Admitting: Family Medicine

## 2021-01-19 ENCOUNTER — Other Ambulatory Visit: Payer: Self-pay

## 2021-01-19 ENCOUNTER — Encounter: Payer: Self-pay | Admitting: Family Medicine

## 2021-01-19 ENCOUNTER — Ambulatory Visit (INDEPENDENT_AMBULATORY_CARE_PROVIDER_SITE_OTHER): Payer: 59 | Admitting: Family Medicine

## 2021-01-19 VITALS — BP 117/73 | HR 63 | Temp 97.2°F | Ht 65.5 in | Wt 150.4 lb

## 2021-01-19 DIAGNOSIS — E119 Type 2 diabetes mellitus without complications: Secondary | ICD-10-CM

## 2021-01-19 MED ORDER — METFORMIN HCL ER 500 MG PO TB24: 500.0000 mg | ORAL_TABLET | Freq: Two times a day (BID) | ORAL | 0 refills | Status: DC

## 2021-01-19 NOTE — Progress Notes (Signed)
Subjective:  Patient ID: George Kane, male    DOB: 27-Jul-1971  Age: 50 y.o. MRN: [id number]  CC: Diabetes   HPI George Kane presents forFollow-up of diabetes. Patient checks blood sugar at home.  No log returned. Readings in mid 100s Patient denies symptoms such as polyuria, polydipsia, excessive hunger, nausea No significant hypoglycemic spells noted. Medications reviewed. Pt reports taking them regularly without complication/adverse reaction being reported today.  Patient reports extensive change in his diet from discontinuation of sodas and fast food and alcohol.  He is drinking more water.  He is exercising vigorously.  Of note is that he has lost 8 pounds since his diagnosis  History George Kane has no past medical history on file.   He has no past surgical history on file.   His family history includes Cancer in his mother; Healthy in his brother and sister.He reports that he has never smoked. He has never used smokeless tobacco. He reports that he does not drink alcohol and does not use drugs.  Current Outpatient Medications on File Prior to Visit  Medication Sig Dispense Refill  . atorvastatin (LIPITOR) 80 MG tablet Take 1 tablet (80 mg total) by mouth daily. 90 tablet 1  . blood glucose meter kit and supplies KIT Dispense based on patient and insurance preference. Use up to four times daily as directed. (FOR ICD-10 : E11.9 1 each 11  . glucose blood (ONETOUCH VERIO) test strip Check BS up to four times daily Dx E11.9 400 strip 3  . Vitamin D, Ergocalciferol, (DRISDOL) 1.25 MG (50000 UNIT) CAPS capsule Take 1 capsule (50,000 Units total) by mouth every 7 (seven) days. 13 capsule 3   No current facility-administered medications on file prior to visit.    ROS Review of Systems  Constitutional: Negative for fever.  Respiratory: Negative for shortness of breath.   Cardiovascular: Negative for chest pain.  Musculoskeletal: Negative for arthralgias.  Skin:  Negative for rash.    Objective:  BP 117/73   Pulse 63   Temp (!) 97.2 F (36.2 C)   Ht 5' 5.5" (1.664 m)   Wt 150 lb 6.4 oz (68.2 kg)   SpO2 98%   BMI 24.65 kg/m   BP Readings from Last 3 Encounters:  01/19/21 117/73  12/08/20 126/65  11/01/20 132/81    Wt Readings from Last 3 Encounters:  01/19/21 150 lb 6.4 oz (68.2 kg)  12/08/20 152 lb 12.8 oz (69.3 kg)  11/01/20 158 lb 3.2 oz (71.8 kg)     Physical Exam Vitals reviewed.  Constitutional:      Appearance: He is well-developed.  HENT:     Head: Normocephalic and atraumatic.     Right Ear: External ear normal.     Left Ear: External ear normal.     Mouth/Throat:     Pharynx: No oropharyngeal exudate or posterior oropharyngeal erythema.  Eyes:     Pupils: Pupils are equal, round, and reactive to light.  Cardiovascular:     Rate and Rhythm: Normal rate and regular rhythm.     Heart sounds: No murmur heard.   Pulmonary:     Effort: No respiratory distress.     Breath sounds: Normal breath sounds.  Musculoskeletal:     Cervical back: Normal range of motion and neck supple.  Neurological:     Mental Status: He is alert and oriented to person, place, and time.       Assessment & Plan:   George Kane was seen today  for diabetes.  Diagnoses and all orders for this visit:  Diabetes mellitus without complication ([place]) -     Microalbumin / creatinine urine ratio  Other orders -     metFORMIN (GLUCOPHAGE-XR) 500 MG 24 hr tablet; Take 1 tablet (500 mg total) by mouth 2 (two) times daily. With breakfast and supper      I am having George Kane. Sherrill Raring" maintain his Vitamin D (Ergocalciferol), blood glucose meter kit and supplies, atorvastatin, OneTouch Verio, and metFORMIN.  Meds ordered this encounter  Medications  . metFORMIN (GLUCOPHAGE-XR) 500 MG 24 hr tablet    Sig: Take 1 tablet (500 mg total) by mouth 2 (two) times daily. With breakfast and supper    Dispense:  180 tablet    Refill:  0      Follow-up: Return in about 6 weeks (around 03/02/2021) for diabetes.  Claretta Fraise, M.D.

## 2021-01-20 ENCOUNTER — Encounter: Payer: Self-pay | Admitting: Family Medicine

## 2021-01-20 LAB — MICROALBUMIN / CREATININE URINE RATIO
Creatinine, Urine: 86.2 mg/dL
Microalb/Creat Ratio: <3 mg/g creat (ref 0–29)
Microalbumin, Urine: <3 ug/mL

## 2021-01-20 NOTE — Progress Notes (Signed)
Hello [place],  Your lab result is normal and/or stable.Some minor variations that are not significant are commonly marked abnormal, but do not represent any medical problem for you.  Best regards, Zavannah Deblois, M.D.

## 2021-01-28 ENCOUNTER — Ambulatory Visit: Payer: Self-pay | Admitting: Registered"

## 2021-03-04 ENCOUNTER — Ambulatory Visit: Payer: 59 | Admitting: Family Medicine

## 2021-03-07 ENCOUNTER — Encounter: Payer: 59 | Attending: Family Medicine | Admitting: Registered"

## 2021-03-07 ENCOUNTER — Encounter: Payer: Self-pay | Admitting: Registered"

## 2021-03-07 ENCOUNTER — Other Ambulatory Visit: Payer: Self-pay

## 2021-03-07 DIAGNOSIS — E119 Type 2 diabetes mellitus without complications: Secondary | ICD-10-CM

## 2021-03-07 NOTE — Patient Instructions (Addendum)
Plan:  Aim for (2-5) 30-75  grams   Aim for 0-3 Carbs choices (0-45 grams) per snack if hungry  Include protein with your meals and snacks Consider reading food labels for Total Carbohydrate of foods Continue your activity as tolerated Continue checking blood sugar at alternate times per day  You can call your insurance to see if the Continuous Glucose Monitor is covered. Freestyle Truitt Merle is one brand. Continue taking medication as directed by MD

## 2021-03-07 NOTE — Progress Notes (Signed)
Diabetes Self-Management Education  Visit Type: First/Initial  Appt. Start Time: 0800 Appt. End Time: 0915  03/07/2021  [name], identified by name and date of birth, is a 50 y.o. male with a diagnosis of Diabetes: Type 2.   ASSESSMENT  There were no vitals taken for this visit. There is no height or weight on file to calculate BMI.  Relevant labs: A1c 9.1% (c-peptide WNL) 12/08/20 Vit D 12.3 (30.0-100.0) TG 303 mg/dL; HDL 39, VLDL 49 (5-40) GFR 108  Medication: Met 500 bid Pt reports tolerating well.  Pt states he was scared and confused by the diagnosis. Pt states he thought diabetes was only an issue with obesity. Pt states he loves to eat and can eat a lot, fast metabolism. Pt reports after diagnosis he made changes to diet & livestyle including: cut out soda, reduced starchy food intake, switched to whole wheat bread and started including more fruit and vegetables. Other than sodas, pt states he did not eat a lot of sweets.  Pt states he wants to know what fruits are good and how he should be eating.  Pt states he does not tolerate heat well and feels he sweats excessively for someone his size (small)   Diabetes Self-Management Education - 03/07/21 0805       Visit Information   Visit Type First/Initial      Initial Visit   Diabetes Type Type 2    Are you currently following a meal plan? No    Are you taking your medications as prescribed? Yes   metformin 500 bid   Date Diagnosed 12/08/20      Health Coping   How would you rate your overall health? Good      Psychosocial Assessment   Patient Belief/Attitude about Diabetes Motivated to manage diabetes    How often do you need to have someone help you when you read instructions, pamphlets, or other written materials from your doctor or pharmacy? 1 - Never    What is the last grade level you completed in school? 12      Complications   Last HgB A1C per patient/outside source 9.1 %    How often do you check  your blood sugar? 1-2 times/day    Fasting Blood glucose range (mg/dL) 70-129    Postprandial Blood glucose range (mg/dL) 130-179;180-200    Have you had a dilated eye exam in the past 12 months? Yes    Have you had a dental exam in the past 12 months? Yes    Are you checking your feet? Yes    How many days per week are you checking your feet? 7      Dietary Intake   Breakfast eggs, wheat bread 2 OR cereal (healthy) when working    Snack (morning) fruit snacks    Lunch fish, pasta,    Snack (afternoon) bananas & apples    Dinner sandwich - Ham and [place], letuce, tomato OR salad with a lot of ranch dressing.    Snack (evening) none, fruit bar    Beverage(s) water, diet tea      Exercise   Exercise Type Light (walking / raking leaves)    How many days per week to you exercise? 3    How many minutes per day do you exercise? 60    Total minutes per week of exercise 180      Patient Education   Previous Diabetes Education No    Disease state  Definition of  diabetes, type 1 and 2, and the diagnosis of diabetes    Nutrition management  Role of diet in the treatment of diabetes and the relationship between the three main macronutrients and blood glucose level;Food label reading, portion sizes and measuring food.;Carbohydrate counting    Physical activity and exercise  Role of exercise on diabetes management, blood pressure control and cardiac health.    Medications Reviewed patients medication for diabetes, action, purpose, timing of dose and side effects.    Monitoring Identified appropriate SMBG and/or A1C goals.;Purpose and frequency of SMBG.      Individualized Goals (developed by patient)   Nutrition General guidelines for healthy choices and portions discussed    Physical Activity Exercise 3-5 times per week    Medications take my medication as prescribed    Monitoring  test my blood glucose as discussed      Outcomes   Expected Outcomes Demonstrated interest in learning. Expect  positive outcomes    Future DMSE 2 months    Program Status Not Completed             Individualized Plan for Diabetes Self-Management Training:   Learning Objective:  Patient will have a greater understanding of diabetes self-management. Patient education plan is to attend individual and/or group sessions per assessed needs and concerns.   Patient Instructions  Plan:  Aim for (2-5) 30-75  grams   Aim for 0-3 Carbs choices (0-45 grams) per snack if hungry  Include protein with your meals and snacks Consider reading food labels for Total Carbohydrate of foods Continue your activity as tolerated Continue checking blood sugar at alternate times per day  You can call your insurance to see if the Continuous Glucose Monitor is covered. Freestyle [name] is one brand. Continue taking medication as directed by MD   Expected Outcomes:  Demonstrated interest in learning. Expect positive outcomes  Education material provided: ADA - How to Thrive: A Guide for Your Journey with Diabetes, Food label handouts, A1C conversion sheet, and Carbohydrate counting sheet, blood glucose log book  If problems or questions, patient to contact team via:  Phone  Future DSME appointment: 2 months

## 2021-03-27 ENCOUNTER — Other Ambulatory Visit: Payer: Self-pay | Admitting: Family Medicine

## 2021-05-02 ENCOUNTER — Ambulatory Visit: Payer: 59 | Admitting: Family Medicine

## 2021-06-05 ENCOUNTER — Other Ambulatory Visit: Payer: Self-pay | Admitting: Family Medicine

## 2021-06-17 ENCOUNTER — Other Ambulatory Visit: Payer: Self-pay | Admitting: Family Medicine

## 2021-07-21 ENCOUNTER — Ambulatory Visit: Payer: 59 | Admitting: Family Medicine

## 2021-09-27 ENCOUNTER — Other Ambulatory Visit: Payer: Self-pay | Admitting: Family Medicine

## 2021-10-26 ENCOUNTER — Other Ambulatory Visit: Payer: Self-pay | Admitting: Family Medicine

## 2021-12-21 ENCOUNTER — Other Ambulatory Visit: Payer: Self-pay | Admitting: Family Medicine

## 2021-12-24 ENCOUNTER — Other Ambulatory Visit: Payer: Self-pay | Admitting: Family Medicine

## 2021-12-26 ENCOUNTER — Encounter: Payer: Self-pay | Admitting: Family Medicine

## 2021-12-26 NOTE — Telephone Encounter (Signed)
30 days given pt ntbs  ?

## 2021-12-26 NOTE — Telephone Encounter (Signed)
CALLED PT TO SCHEDULE APPT N/A NO VM LETTER MAILED   

## 2022-01-19 ENCOUNTER — Telehealth: Payer: Self-pay | Admitting: Family Medicine

## 2022-01-19 ENCOUNTER — Other Ambulatory Visit: Payer: Self-pay | Admitting: Family Medicine

## 2022-01-19 ENCOUNTER — Other Ambulatory Visit: Payer: Self-pay | Admitting: *Deleted

## 2022-01-19 ENCOUNTER — Encounter: Payer: Self-pay | Admitting: Family Medicine

## 2022-01-19 MED ORDER — METFORMIN HCL ER 500 MG PO TB24: 500.0000 mg | ORAL_TABLET | Freq: Two times a day (BID) | ORAL | 0 refills | Status: DC

## 2022-01-19 NOTE — Telephone Encounter (Signed)
Pt scheduled to see PCP on 02/15/22 for med refill ?

## 2022-01-19 NOTE — Telephone Encounter (Signed)
Refilled as requested  

## 2022-01-19 NOTE — Telephone Encounter (Signed)
Last seen 01/19/21 ?Needs appointment  ?

## 2022-01-19 NOTE — Telephone Encounter (Signed)
?  Prescription Request ? ?01/19/2022 ? ?Is this a "Controlled Substance" medicine? no ? ?Have you seen your PCP in the last 2 weeks? No pt has appt with PCP on 02/15/2022 ? ?If YES, route message to pool  -  If NO, patient needs to be scheduled for appointment. ? ?What is the name of the medication or equipment? metFORMIN (GLUCOPHAGE-XR) 500 MG 24 hr tablet [Pharmacy Med Name: METFORMIN HCL ER 500 MG TABLET] ? ?Have you contacted your pharmacy to request a refill? yes  ? ?Which pharmacy would you like this sent to? [facility] ? ? ?Patient notified that their request is being sent to the clinical staff for review and that they should receive a response within 2 business days.  ? ? ?

## 2022-01-21 ENCOUNTER — Other Ambulatory Visit: Payer: Self-pay | Admitting: Family Medicine

## 2022-01-30 ENCOUNTER — Ambulatory Visit: Payer: 59 | Admitting: Family Medicine

## 2022-02-14 ENCOUNTER — Other Ambulatory Visit: Payer: Self-pay | Admitting: Family Medicine

## 2022-02-15 ENCOUNTER — Ambulatory Visit (INDEPENDENT_AMBULATORY_CARE_PROVIDER_SITE_OTHER): Payer: 59 | Admitting: Family Medicine

## 2022-02-15 ENCOUNTER — Encounter: Payer: Self-pay | Admitting: Family Medicine

## 2022-02-15 VITALS — BP 138/82 | HR 67 | Temp 97.9°F | Ht 65.5 in | Wt 157.6 lb

## 2022-02-15 DIAGNOSIS — Z0001 Encounter for general adult medical examination with abnormal findings: Secondary | ICD-10-CM | POA: Diagnosis not present

## 2022-02-15 DIAGNOSIS — E782 Mixed hyperlipidemia: Secondary | ICD-10-CM | POA: Diagnosis not present

## 2022-02-15 DIAGNOSIS — E119 Type 2 diabetes mellitus without complications: Secondary | ICD-10-CM

## 2022-02-15 DIAGNOSIS — Z1211 Encounter for screening for malignant neoplasm of colon: Secondary | ICD-10-CM

## 2022-02-15 DIAGNOSIS — Z0000 Encounter for general adult medical examination without abnormal findings: Secondary | ICD-10-CM

## 2022-02-15 LAB — BAYER DCA HB A1C WAIVED: HB A1C (BAYER DCA - WAIVED): 7 % — ABNORMAL HIGH (ref 4.8–5.6)

## 2022-02-15 NOTE — Progress Notes (Signed)
Subjective:  Patient ID: George Kane, male    DOB: 29-Mar-1971  Age: 51 y.o. MRN: [id number]  CC: Annual Exam   HPI George Kane presents for CPE  presents forFollow-up of diabetes. Patient checks blood sugar at home.   98 fasting and 142 postprandial Patient denies symptoms such as polyuria, polydipsia, excessive hunger, nausea No significant hypoglycemic spells noted. Medications reviewed. Pt reports taking them regularly without complication/adverse reaction being reported today.  Lab Results  Component Value Date   HGBA1C 7.0 (H) 02/15/2022   HGBA1C 10.3 (H) 11/02/2020   HGBA1C 9.1 (H) 09/29/2020         02/15/2022    2:51 PM 02/15/2022    2:45 PM 03/07/2021    8:01 AM  Depression screen PHQ 2/9  Decreased Interest 0 0 0  Down, Depressed, Hopeless 0 0 0  PHQ - 2 Score 0 0 0  Altered sleeping 0    Tired, decreased energy 1    Change in appetite 0    Feeling bad or failure about yourself  0    Trouble concentrating 0    Suicidal thoughts 0    PHQ-9 Score 1    Difficult doing work/chores Not difficult at all      History George Kane has no past medical history on file.   He has no past surgical history on file.   His family history includes Cancer in his mother; Healthy in his brother and sister.He reports that he has never smoked. He has never used smokeless tobacco. He reports that he does not drink alcohol and does not use drugs.    ROS Review of Systems  Constitutional:  Negative for activity change, fatigue, fever and unexpected weight change.  HENT:  Negative for congestion, ear pain, hearing loss, postnasal drip and trouble swallowing.   Eyes:  Positive for visual disturbance (blurred vision with reading.). Negative for pain.  Respiratory:  Negative for cough, chest tightness and shortness of breath.   Cardiovascular:  Negative for chest pain, palpitations and leg swelling.  Gastrointestinal:  Negative for abdominal distention, abdominal  pain, blood in stool, constipation, diarrhea, nausea and vomiting.  Endocrine: Negative for cold intolerance, heat intolerance and polydipsia.  Genitourinary:  Negative for difficulty urinating, dysuria, flank pain, frequency and urgency.  Musculoskeletal:  Negative for arthralgias and joint swelling.  Skin:  Negative for color change, rash and wound.  Neurological:  Negative for dizziness, syncope, speech difficulty, weakness, light-headedness, numbness and headaches.  Hematological:  Does not bruise/bleed easily.  Psychiatric/Behavioral:  Negative for confusion, decreased concentration, dysphoric mood and sleep disturbance. The patient is not nervous/anxious.    Objective:  BP 138/82   Pulse 67   Temp 97.9 F (36.6 C)   Ht 5' 5.5" (1.664 m)   Wt 157 lb 9.6 oz (71.5 kg)   SpO2 95%   BMI 25.83 kg/m   BP Readings from Last 3 Encounters:  02/15/22 138/82  01/19/21 117/73  12/08/20 126/65    Wt Readings from Last 3 Encounters:  02/15/22 157 lb 9.6 oz (71.5 kg)  01/19/21 150 lb 6.4 oz (68.2 kg)  12/08/20 152 lb 12.8 oz (69.3 kg)     Physical Exam Constitutional:      Appearance: He is well-developed.  HENT:     Head: Normocephalic and atraumatic.  Eyes:     Pupils: Pupils are equal, round, and reactive to light.  Neck:     Thyroid: No thyromegaly.     Trachea: No  tracheal deviation.  Cardiovascular:     Rate and Rhythm: Normal rate and regular rhythm.     Heart sounds: Normal heart sounds. No murmur heard.   No friction rub. No gallop.  Pulmonary:     Breath sounds: Normal breath sounds. No wheezing or rales.  Abdominal:     General: Bowel sounds are normal. There is no distension.     Palpations: Abdomen is soft. There is no mass.     Tenderness: There is no abdominal tenderness.     Hernia: There is no hernia in the left inguinal area.  Genitourinary:    Penis: Normal.      Testes: Normal.  Musculoskeletal:        General: Normal range of motion.     Cervical  back: Normal range of motion.  Lymphadenopathy:     Cervical: No cervical adenopathy.  Skin:    General: Skin is warm and dry.  Neurological:     Mental Status: He is alert and oriented to person, place, and time.      Assessment & Plan:   Allan was seen today for annual exam.  Diagnoses and all orders for this visit:  Well adult exam -     Bayer DCA Hb A1c Waived -     CBC with Differential/Platelet -     CMP14+EGFR -     Lipid panel -     Urinalysis  Diabetes mellitus without complication (HCC) -     Bayer DCA Hb A1c Waived -     CBC with Differential/Platelet -     CMP14+EGFR -     Microalbumin / creatinine urine ratio  Mixed hyperlipidemia -     Lipid panel  Screen for colon cancer -     Ambulatory referral to Gastroenterology       I am having Lynnell Chad. Verneita Griffes "[name]" maintain his blood glucose meter kit and supplies, Omega-3 Fatty Acids (FISH OIL CONCENTRATE PO), Vitamin D (Ergocalciferol), OneTouch Verio, atorvastatin, and metFORMIN.  Allergies as of 02/15/2022       Reactions   Neosporin [neomycin-bacitracin Zn-polymyx] Itching        Medication List        Accurate as of Feb 15, 2022  5:21 PM. If you have any questions, ask your nurse or doctor.          atorvastatin 80 MG tablet Commonly known as: LIPITOR TAKE 1 TABLET BY MOUTH EVERY DAY   blood glucose meter kit and supplies Kit Dispense based on patient and insurance preference. Use up to four times daily as directed. (FOR ICD-10 : E11.9   FISH OIL CONCENTRATE PO Take by mouth.   metFORMIN 500 MG 24 hr tablet Commonly known as: GLUCOPHAGE-XR Take 1 tablet (500 mg total) by mouth 2 (two) times daily. With breakfast and supper.   OneTouch Verio test strip Generic drug: glucose blood CHECK BLOOD SUGAR UP TO FOUR TIMES DAILY DX E11.9   Vitamin D (Ergocalciferol) 1.25 MG (50000 UNIT) Caps capsule Commonly known as: DRISDOL TAKE 1 CAPSULE (50,000 UNITS TOTAL) BY MOUTH EVERY 7  (SEVEN) DAYS         Follow-up: Return in about 3 months (around 05/18/2022).  Claretta Fraise, M.D.

## 2022-02-16 LAB — CBC WITH DIFFERENTIAL/PLATELET
Basophils Absolute: 0 10*3/uL (ref 0.0–0.2)
Basos: 1 %
EOS (ABSOLUTE): 0.1 10*3/uL (ref 0.0–0.4)
Eos: 1 %
Hematocrit: 42.3 % (ref 37.5–51.0)
Hemoglobin: 14.2 g/dL (ref 13.0–17.7)
Immature Grans (Abs): 0 10*3/uL (ref 0.0–0.1)
Immature Granulocytes: 0 %
Lymphocytes Absolute: 2.4 10*3/uL (ref 0.7–3.1)
Lymphs: 38 %
MCH: 27.8 pg (ref 26.6–33.0)
MCHC: 33.6 g/dL (ref 31.5–35.7)
MCV: 83 fL (ref 79–97)
Monocytes Absolute: 0.5 10*3/uL (ref 0.1–0.9)
Monocytes: 8 %
Neutrophils Absolute: 3.4 10*3/uL (ref 1.4–7.0)
Neutrophils: 52 %
Platelets: 287 10*3/uL (ref 150–450)
RBC: 5.11 x10E6/uL (ref 4.14–5.80)
RDW: 13.8 % (ref 11.6–15.4)
WBC: 6.4 10*3/uL (ref 3.4–10.8)

## 2022-02-16 LAB — CMP14+EGFR
ALT: 53 IU/L — ABNORMAL HIGH (ref 0–44)
AST: 40 IU/L (ref 0–40)
Albumin/Globulin Ratio: 1.8 (ref 1.2–2.2)
Albumin: 4.6 g/dL (ref 4.0–5.0)
Alkaline Phosphatase: 108 IU/L (ref 44–121)
BUN/Creatinine Ratio: 10 (ref 9–20)
BUN: 8 mg/dL (ref 6–24)
Bilirubin Total: 0.6 mg/dL (ref 0.0–1.2)
CO2: 26 mmol/L (ref 20–29)
Calcium: 9.5 mg/dL (ref 8.7–10.2)
Chloride: 104 mmol/L (ref 96–106)
Creatinine, Ser: 0.83 mg/dL (ref 0.76–1.27)
Globulin, Total: 2.5 g/dL (ref 1.5–4.5)
Glucose: 155 mg/dL — ABNORMAL HIGH (ref 70–99)
Potassium: 3.8 mmol/L (ref 3.5–5.2)
Sodium: 144 mmol/L (ref 134–144)
Total Protein: 7.1 g/dL (ref 6.0–8.5)
eGFR: 108 mL/min/{1.73_m2} (ref >59)

## 2022-02-16 LAB — LIPID PANEL
Chol/HDL Ratio: 3.2 ratio (ref 0.0–5.0)
Cholesterol, Total: 127 mg/dL (ref 100–199)
HDL: 40 mg/dL (ref >39)
LDL Chol Calc (NIH): 51 mg/dL (ref 0–99)
Triglycerides: 229 mg/dL — ABNORMAL HIGH (ref 0–149)
VLDL Cholesterol Cal: 36 mg/dL (ref 5–40)

## 2022-02-16 NOTE — Progress Notes (Signed)
Hello [place],  Your lab result is normal and/or stable.Some minor variations that are not significant are commonly marked abnormal, but do not represent any medical problem for you.  Best regards, Emmogene Simson, M.D.

## 2022-04-20 ENCOUNTER — Other Ambulatory Visit: Payer: Self-pay | Admitting: Family Medicine

## 2022-05-13 ENCOUNTER — Other Ambulatory Visit: Payer: Self-pay | Admitting: Family Medicine

## 2022-05-18 ENCOUNTER — Ambulatory Visit: Payer: 59 | Admitting: Family Medicine

## 2022-06-02 ENCOUNTER — Telehealth: Payer: Self-pay | Admitting: Family Medicine

## 2022-06-04 ENCOUNTER — Other Ambulatory Visit: Payer: Self-pay | Admitting: Family Medicine

## 2022-06-04 DIAGNOSIS — E782 Mixed hyperlipidemia: Secondary | ICD-10-CM

## 2022-06-04 DIAGNOSIS — E119 Type 2 diabetes mellitus without complications: Secondary | ICD-10-CM | POA: Insufficient documentation

## 2022-06-04 NOTE — Telephone Encounter (Signed)
Order written

## 2022-06-07 ENCOUNTER — Other Ambulatory Visit: Payer: Self-pay | Admitting: *Deleted

## 2022-06-07 ENCOUNTER — Ambulatory Visit (INDEPENDENT_AMBULATORY_CARE_PROVIDER_SITE_OTHER): Payer: 59 | Admitting: Family Medicine

## 2022-06-07 ENCOUNTER — Encounter: Payer: Self-pay | Admitting: Family Medicine

## 2022-06-07 VITALS — BP 138/77 | HR 69 | Temp 97.9°F | Ht 65.5 in | Wt 154.6 lb

## 2022-06-07 DIAGNOSIS — E119 Type 2 diabetes mellitus without complications: Secondary | ICD-10-CM

## 2022-06-07 DIAGNOSIS — E782 Mixed hyperlipidemia: Secondary | ICD-10-CM | POA: Diagnosis not present

## 2022-06-07 DIAGNOSIS — Z23 Encounter for immunization: Secondary | ICD-10-CM

## 2022-06-07 LAB — CMP14+EGFR
ALT: 58 IU/L — ABNORMAL HIGH (ref 0–44)
AST: 45 IU/L — ABNORMAL HIGH (ref 0–40)
Albumin/Globulin Ratio: 1.7 (ref 1.2–2.2)
Albumin: 4.7 g/dL (ref 4.1–5.1)
Alkaline Phosphatase: 98 IU/L (ref 44–121)
BUN/Creatinine Ratio: 14 (ref 9–20)
BUN: 11 mg/dL (ref 6–24)
Bilirubin Total: 0.6 mg/dL (ref 0.0–1.2)
CO2: 26 mmol/L (ref 20–29)
Calcium: 9.1 mg/dL (ref 8.7–10.2)
Chloride: 102 mmol/L (ref 96–106)
Creatinine, Ser: 0.78 mg/dL (ref 0.76–1.27)
Globulin, Total: 2.7 g/dL (ref 1.5–4.5)
Glucose: 134 mg/dL — ABNORMAL HIGH (ref 70–99)
Potassium: 3.8 mmol/L (ref 3.5–5.2)
Sodium: 143 mmol/L (ref 134–144)
Total Protein: 7.4 g/dL (ref 6.0–8.5)
eGFR: 110 mL/min/{1.73_m2} (ref >59)

## 2022-06-07 LAB — LIPID PANEL
Chol/HDL Ratio: 3.4 ratio (ref 0.0–5.0)
Cholesterol, Total: 146 mg/dL (ref 100–199)
HDL: 43 mg/dL (ref >39)
LDL Chol Calc (NIH): 73 mg/dL (ref 0–99)
Triglycerides: 179 mg/dL — ABNORMAL HIGH (ref 0–149)
VLDL Cholesterol Cal: 30 mg/dL (ref 5–40)

## 2022-06-07 LAB — BAYER DCA HB A1C WAIVED: HB A1C (BAYER DCA - WAIVED): 6.9 % — ABNORMAL HIGH (ref 4.8–5.6)

## 2022-06-07 LAB — CBC WITH DIFFERENTIAL/PLATELET
Basophils Absolute: 0 10*3/uL (ref 0.0–0.2)
Basos: 1 %
EOS (ABSOLUTE): 0.1 10*3/uL (ref 0.0–0.4)
Eos: 1 %
Hematocrit: 43.8 % (ref 37.5–51.0)
Hemoglobin: 14.5 g/dL (ref 13.0–17.7)
Immature Grans (Abs): 0 10*3/uL (ref 0.0–0.1)
Immature Granulocytes: 0 %
Lymphocytes Absolute: 2.3 10*3/uL (ref 0.7–3.1)
Lymphs: 40 %
MCH: 27.8 pg (ref 26.6–33.0)
MCHC: 33.1 g/dL (ref 31.5–35.7)
MCV: 84 fL (ref 79–97)
Monocytes Absolute: 0.4 10*3/uL (ref 0.1–0.9)
Monocytes: 6 %
Neutrophils Absolute: 3.1 10*3/uL (ref 1.4–7.0)
Neutrophils: 52 %
Platelets: 267 10*3/uL (ref 150–450)
RBC: 5.22 x10E6/uL (ref 4.14–5.80)
RDW: 13.8 % (ref 11.6–15.4)
WBC: 5.9 10*3/uL (ref 3.4–10.8)

## 2022-06-07 MED ORDER — ATORVASTATIN CALCIUM 80 MG PO TABS: 80.0000 mg | ORAL_TABLET | Freq: Every day | ORAL | 2 refills | Status: DC

## 2022-06-07 MED ORDER — METFORMIN HCL ER 500 MG PO TB24: 500.0000 mg | ORAL_TABLET | Freq: Two times a day (BID) | ORAL | 2 refills | Status: DC

## 2022-06-07 NOTE — Progress Notes (Signed)
Subjective:  Patient ID: George Kane, male    DOB: 03/10/1971  Age: 51 y.o. MRN: [id number]  CC: Medical Management of Chronic Issues   HPI George Kane presents forFollow-up of diabetes. Patient checks blood sugar at home.   98 fasting and 134 postprandial Patient denies symptoms such as polyuria, polydipsia, excessive hunger, nausea No significant hypoglycemic spells noted. Medications reviewed. Pt reports taking them regularly without complication/adverse reaction being reported today.   Exercise - hiking, gym for treadmill. HAs a trip to [place] next month to see [place] has no past medical history on file.   He has no past surgical history on file.   His family history includes Cancer in his mother; Healthy in his brother and sister.He reports that he has never smoked. He has never used smokeless tobacco. He reports that he does not drink alcohol and does not use drugs.  Current Outpatient Medications on File Prior to Visit  Medication Sig Dispense Refill   blood glucose meter kit and supplies KIT Dispense based on patient and insurance preference. Use up to four times daily as directed. (FOR ICD-10 : E11.9 1 each 11   glucose blood (ONETOUCH VERIO) test strip CHECK BLOOD SUGAR UP TO FOUR TIMES DAILY DX E11.9 400 strip 3   Omega-3 Fatty Acids (FISH OIL CONCENTRATE PO) Take by mouth.     Vitamin D, Ergocalciferol, (DRISDOL) 1.25 MG (50000 UNIT) CAPS capsule TAKE 1 CAPSULE (50,000 UNITS TOTAL) BY MOUTH EVERY 7 (SEVEN) DAYS 13 capsule 3   No current facility-administered medications on file prior to visit.    ROS Review of Systems  Constitutional:  Negative for fever.  Respiratory:  Negative for shortness of breath.   Cardiovascular:  Negative for chest pain.  Musculoskeletal:  Negative for arthralgias.  Skin:  Negative for rash.    Objective:  BP 138/77   Pulse 69   Temp 97.9 F (36.6 C)   Ht 5' 5.5" (1.664 m)   Wt 154 lb 9.6 oz  (70.1 kg)   SpO2 98%   BMI 25.34 kg/m   BP Readings from Last 3 Encounters:  06/07/22 138/77  02/15/22 138/82  01/19/21 117/73    Wt Readings from Last 3 Encounters:  06/07/22 154 lb 9.6 oz (70.1 kg)  02/15/22 157 lb 9.6 oz (71.5 kg)  01/19/21 150 lb 6.4 oz (68.2 kg)     Physical Exam Vitals reviewed.  Constitutional:      Appearance: He is well-developed.  HENT:     Head: Normocephalic and atraumatic.     Right Ear: External ear normal.     Left Ear: External ear normal.     Mouth/Throat:     Pharynx: No oropharyngeal exudate or posterior oropharyngeal erythema.  Eyes:     Pupils: Pupils are equal, round, and reactive to light.  Cardiovascular:     Rate and Rhythm: Normal rate and regular rhythm.     Heart sounds: No murmur heard. Pulmonary:     Effort: No respiratory distress.     Breath sounds: Normal breath sounds.  Musculoskeletal:     Cervical back: Normal range of motion and neck supple.  Neurological:     Mental Status: He is alert and oriented to person, place, and time.       Assessment & Plan:   George Kane was seen today for medical management of chronic issues.  Diagnoses and all orders for this visit:  Flu vaccine need -  Flu Vaccine QUAD 6+ mos PF IM (Fluarix Quad PF)  Mixed hyperlipidemia -     Lipid panel  Diabetes mellitus without complication (HCC) -     CMP14+EGFR -     CBC with Differential/Platelet -     Bayer DCA Hb A1c Waived  Other orders -     atorvastatin (LIPITOR) 80 MG tablet; Take 1 tablet (80 mg total) by mouth daily. -     metFORMIN (GLUCOPHAGE-XR) 500 MG 24 hr tablet; Take 1 tablet (500 mg total) by mouth 2 (two) times daily. With breakfast and supper.      I have changed George Kane. George Griffes "[name]"'s atorvastatin. I am also having him maintain his blood glucose meter kit and supplies, Omega-3 Fatty Acids (FISH OIL CONCENTRATE PO), Vitamin D (Ergocalciferol), OneTouch Verio, and metFORMIN.  Meds ordered this encounter   Medications   atorvastatin (LIPITOR) 80 MG tablet    Sig: Take 1 tablet (80 mg total) by mouth daily.    Dispense:  90 tablet    Refill:  2   metFORMIN (GLUCOPHAGE-XR) 500 MG 24 hr tablet    Sig: Take 1 tablet (500 mg total) by mouth 2 (two) times daily. With breakfast and supper.    Dispense:  180 tablet    Refill:  2     Follow-up: Return in about 3 months (around 09/06/2022) for diabetes.  Claretta Fraise, M.D.

## 2022-06-08 NOTE — Progress Notes (Signed)
Hello [place],  Your lab result is normal and/or stable.Some minor variations that are not significant are commonly marked abnormal, but do not represent any medical problem for you.  Best regards, Lesieli Bresee, M.D.

## 2022-06-27 ENCOUNTER — Other Ambulatory Visit: Payer: Self-pay | Admitting: Family Medicine

## 2022-06-27 MED ORDER — VITAMIN D (ERGOCALCIFEROL) 1.25 MG (50000 UNIT) PO CAPS: 50000.0000 [IU] | ORAL_CAPSULE | ORAL | 1 refills | Status: AC

## 2022-06-27 NOTE — Telephone Encounter (Signed)
  Prescription Request  06/27/2022  Is this a "Controlled Substance" medicine? No   Have you seen your PCP in the last 2 weeks? 05/2022  If YES, route message to pool  -  If NO, patient needs to be scheduled for appointment.  What is the name of the medication or equipment? Vit d  Have you contacted your pharmacy to request a refill? no   [facility]   Patient notified that their request is being sent to the clinical staff for review and that they should receive a response within 2 business days.

## 2022-06-27 NOTE — Telephone Encounter (Signed)
NA/NVM refill sent to pharmacy 

## 2022-09-13 ENCOUNTER — Encounter: Payer: Self-pay | Admitting: Family Medicine

## 2022-09-13 ENCOUNTER — Ambulatory Visit (INDEPENDENT_AMBULATORY_CARE_PROVIDER_SITE_OTHER): Payer: 59 | Admitting: Family Medicine

## 2022-09-13 VITALS — BP 131/75 | HR 66 | Temp 97.7°F | Ht 65.5 in | Wt 156.4 lb

## 2022-09-13 DIAGNOSIS — H524 Presbyopia: Secondary | ICD-10-CM

## 2022-09-13 DIAGNOSIS — Z23 Encounter for immunization: Secondary | ICD-10-CM | POA: Diagnosis not present

## 2022-09-13 DIAGNOSIS — E119 Type 2 diabetes mellitus without complications: Secondary | ICD-10-CM

## 2022-09-13 DIAGNOSIS — E782 Mixed hyperlipidemia: Secondary | ICD-10-CM

## 2022-09-13 NOTE — Progress Notes (Signed)
Subjective:  Patient ID: Laurice Kimmons,  male    DOB: 10/26/1970  Age: 51 y.o.    CC: Medical Management of Chronic Issues   HPI Trayton Szabo presents for  follow-up of hypertension. Patient has no history of headache chest pain or shortness of breath or recent cough. Patient also denies symptoms of TIA such as numbness weakness lateralizing. Patient denies side effects from medication. States taking it regularly.  Patient also  in for follow-up of elevated cholesterol. Doing well without complaints on current medication. Denies side effects  including myalgia and arthralgia and nausea. Also in today for liver function testing. Currently no chest pain, shortness of breath or other cardiovascular related symptoms noted.  Follow-up of diabetes. Patient does check blood sugar at home. Readings run higher due to high carb (potatoes) on trip to [facility] Patient denies symptoms such as excessive hunger or urinary frequency, excessive hunger, nausea No significant hypoglycemic spells noted. Medications reviewed. Pt reports taking them regularly. Pt. denies complication/adverse reaction today.    History Bradie has no past medical history on file.   He has no past surgical history on file.   His family history includes Cancer in his mother; Healthy in his brother and sister.He reports that he has never smoked. He has never used smokeless tobacco. He reports that he does not drink alcohol and does not use drugs.  Current Outpatient Medications on File Prior to Visit  Medication Sig Dispense Refill   atorvastatin (LIPITOR) 80 MG tablet Take 1 tablet (80 mg total) by mouth daily. 90 tablet 2   blood glucose meter kit and supplies KIT Dispense based on patient and insurance preference. Use up to four times daily as directed. (FOR ICD-10 : E11.9 1 each 11   glucose blood (ONETOUCH VERIO) test strip CHECK BLOOD SUGAR UP TO FOUR TIMES DAILY DX E11.9 400 strip 3   metFORMIN  (GLUCOPHAGE-XR) 500 MG 24 hr tablet Take 1 tablet (500 mg total) by mouth 2 (two) times daily. With breakfast and supper. 180 tablet 2   Omega-3 Fatty Acids (FISH OIL CONCENTRATE PO) Take by mouth.     Vitamin D, Ergocalciferol, (DRISDOL) 1.25 MG (50000 UNIT) CAPS capsule Take 1 capsule (50,000 Units total) by mouth every 7 (seven) days. 13 capsule 1   No current facility-administered medications on file prior to visit.    ROS Review of Systems  Constitutional: Negative.   HENT: Negative.    Eyes:  Positive for visual disturbance (wearing readers. Close vision decreased).  Respiratory:  Negative for cough and shortness of breath.   Cardiovascular:  Negative for chest pain and leg swelling.  Gastrointestinal:  Negative for abdominal pain, diarrhea, nausea and vomiting.  Genitourinary:  Negative for difficulty urinating.  Musculoskeletal:  Negative for arthralgias and myalgias.  Skin:  Negative for rash.  Neurological:  Negative for headaches.  Psychiatric/Behavioral:  Negative for sleep disturbance.     Objective:  BP 131/75   Pulse 66   Temp 97.7 F (36.5 C)   Ht 5' 5.5" (1.664 m)   Wt 156 lb 6.4 oz (70.9 kg)   SpO2 97%   BMI 25.63 kg/m   BP Readings from Last 3 Encounters:  09/13/22 131/75  06/07/22 138/77  02/15/22 138/82    Wt Readings from Last 3 Encounters:  09/13/22 156 lb 6.4 oz (70.9 kg)  06/07/22 154 lb 9.6 oz (70.1 kg)  02/15/22 157 lb 9.6 oz (71.5 kg)     Physical Exam  Constitutional:      General: He is not in acute distress.    Appearance: He is well-developed.  HENT:     Head: Normocephalic and atraumatic.     Right Ear: External ear normal.     Left Ear: External ear normal.     Nose: Nose normal.  Eyes:     Conjunctiva/sclera: Conjunctivae normal.     Pupils: Pupils are equal, round, and reactive to light.  Cardiovascular:     Rate and Rhythm: Normal rate and regular rhythm.     Heart sounds: Normal heart sounds. No murmur heard. Pulmonary:      Effort: Pulmonary effort is normal. No respiratory distress.     Breath sounds: Normal breath sounds. No wheezing or rales.  Abdominal:     Palpations: Abdomen is soft.     Tenderness: There is no abdominal tenderness.  Musculoskeletal:        General: Normal range of motion.     Cervical back: Normal range of motion and neck supple.  Skin:    General: Skin is warm and dry.  Neurological:     Mental Status: He is alert and oriented to person, place, and time.     Deep Tendon Reflexes: Reflexes are normal and symmetric.  Psychiatric:        Behavior: Behavior normal.        Thought Content: Thought content normal.        Judgment: Judgment normal.     Diabetic Foot Exam - Simple   No data filed     Lab Results  Component Value Date   HGBA1C 6.9 (H) 06/07/2022   HGBA1C 7.0 (H) 02/15/2022   HGBA1C 10.3 (H) 11/02/2020    Assessment & Plan:   Nazire was seen today for medical management of chronic issues.  Diagnoses and all orders for this visit:  Diabetes mellitus without complication ([place]) -     Bayer DCA Hb A1c Waived -     CBC with Differential/Platelet -     CMP14+EGFR  Mixed hyperlipidemia -     Lipid panel  Presbyopia   I am having Lynnell Chad. Verneita Griffes "[name]" maintain his blood glucose meter kit and supplies, Omega-3 Fatty Acids (FISH OIL CONCENTRATE PO), OneTouch Verio, atorvastatin, metFORMIN, and Vitamin D (Ergocalciferol).  He will make appt today to see [place] in [place]   Follow-up: Return in about 3 months (around 12/13/2022).  Claretta Fraise, M.D.

## 2022-09-13 NOTE — Addendum Note (Signed)
Addended by: Diamantina Monks on: 09/13/2022 04:36 PM   Modules accepted: Orders

## 2022-09-14 LAB — CBC WITH DIFFERENTIAL/PLATELET
Basophils Absolute: 0 10*3/uL (ref 0.0–0.2)
Basos: 1 %
EOS (ABSOLUTE): 0.1 10*3/uL (ref 0.0–0.4)
Eos: 1 %
Hematocrit: 45.1 % (ref 37.5–51.0)
Hemoglobin: 15 g/dL (ref 13.0–17.7)
Immature Grans (Abs): 0 10*3/uL (ref 0.0–0.1)
Immature Granulocytes: 0 %
Lymphocytes Absolute: 2.3 10*3/uL (ref 0.7–3.1)
Lymphs: 38 %
MCH: 27.9 pg (ref 26.6–33.0)
MCHC: 33.3 g/dL (ref 31.5–35.7)
MCV: 84 fL (ref 79–97)
Monocytes Absolute: 0.4 10*3/uL (ref 0.1–0.9)
Monocytes: 7 %
Neutrophils Absolute: 3.1 10*3/uL (ref 1.4–7.0)
Neutrophils: 53 %
Platelets: 301 10*3/uL (ref 150–450)
RBC: 5.38 x10E6/uL (ref 4.14–5.80)
RDW: 13.2 % (ref 11.6–15.4)
WBC: 5.9 10*3/uL (ref 3.4–10.8)

## 2022-09-14 LAB — CMP14+EGFR
ALT: 55 IU/L — ABNORMAL HIGH (ref 0–44)
AST: 38 IU/L (ref 0–40)
Albumin/Globulin Ratio: 2 (ref 1.2–2.2)
Albumin: 4.9 g/dL (ref 4.1–5.1)
Alkaline Phosphatase: 105 IU/L (ref 44–121)
BUN/Creatinine Ratio: 9 (ref 9–20)
BUN: 8 mg/dL (ref 6–24)
Bilirubin Total: 0.5 mg/dL (ref 0.0–1.2)
CO2: 26 mmol/L (ref 20–29)
Calcium: 10 mg/dL (ref 8.7–10.2)
Chloride: 101 mmol/L (ref 96–106)
Creatinine, Ser: 0.88 mg/dL (ref 0.76–1.27)
Globulin, Total: 2.5 g/dL (ref 1.5–4.5)
Glucose: 142 mg/dL — ABNORMAL HIGH (ref 70–99)
Potassium: 4 mmol/L (ref 3.5–5.2)
Sodium: 141 mmol/L (ref 134–144)
Total Protein: 7.4 g/dL (ref 6.0–8.5)
eGFR: 105 mL/min/{1.73_m2} (ref >59)

## 2022-09-14 LAB — LIPID PANEL
Chol/HDL Ratio: 3.1 ratio (ref 0.0–5.0)
Cholesterol, Total: 134 mg/dL (ref 100–199)
HDL: 43 mg/dL (ref >39)
LDL Chol Calc (NIH): 62 mg/dL (ref 0–99)
Triglycerides: 176 mg/dL — ABNORMAL HIGH (ref 0–149)
VLDL Cholesterol Cal: 29 mg/dL (ref 5–40)

## 2022-09-14 LAB — BAYER DCA HB A1C WAIVED: HB A1C (BAYER DCA - WAIVED): 7.3 % — ABNORMAL HIGH (ref 4.8–5.6)

## 2022-09-25 NOTE — Progress Notes (Signed)
Hello Rolly,  Your lab result is normal and/or stable.Some minor variations that are not significant are commonly marked abnormal, but do not represent any medical problem for you.  Best regards, Jayon Matton, M.D.

## 2022-12-17 ENCOUNTER — Other Ambulatory Visit: Payer: Self-pay | Admitting: Family Medicine

## 2022-12-18 ENCOUNTER — Ambulatory Visit: Payer: 59 | Admitting: Family Medicine

## 2022-12-25 ENCOUNTER — Ambulatory Visit: Payer: 59 | Admitting: Family Medicine

## 2022-12-28 ENCOUNTER — Encounter: Payer: 59 | Admitting: Family Medicine

## 2023-01-03 ENCOUNTER — Ambulatory Visit (INDEPENDENT_AMBULATORY_CARE_PROVIDER_SITE_OTHER): Payer: 59 | Admitting: Family Medicine

## 2023-01-03 ENCOUNTER — Encounter: Payer: Self-pay | Admitting: Family Medicine

## 2023-01-03 VITALS — BP 139/80 | HR 61 | Temp 97.9°F | Ht 65.5 in | Wt 156.2 lb

## 2023-01-03 DIAGNOSIS — E782 Mixed hyperlipidemia: Secondary | ICD-10-CM | POA: Diagnosis not present

## 2023-01-03 DIAGNOSIS — E559 Vitamin D deficiency, unspecified: Secondary | ICD-10-CM | POA: Diagnosis not present

## 2023-01-03 DIAGNOSIS — E119 Type 2 diabetes mellitus without complications: Secondary | ICD-10-CM | POA: Diagnosis not present

## 2023-01-03 LAB — BAYER DCA HB A1C WAIVED: HB A1C (BAYER DCA - WAIVED): 7.9 % — ABNORMAL HIGH (ref 4.8–5.6)

## 2023-01-03 MED ORDER — METFORMIN HCL ER 750 MG PO TB24: 750.0000 mg | ORAL_TABLET | Freq: Two times a day (BID) | ORAL | 3 refills | Status: DC

## 2023-01-03 NOTE — Progress Notes (Signed)
Subjective:  Patient ID: George Kane, male    DOB: 08-Apr-1971  Age: 52 y.o. MRN: [id number]  CC: Medical Management of Chronic Issues   HPI George Kane presents forFollow-up of diabetes. Patient checks blood sugar at home.   130-150 fasting and 111 postprandial Patient denies symptoms such as polyuria, polydipsia, excessive hunger, nausea No significant hypoglycemic spells noted. Medications reviewed. Pt reports taking them regularly without complication/adverse reaction being reported today.    in for follow-up of elevated cholesterol. Doing well without complaints on current medication. Denies side effects of statin including myalgia and arthralgia and nausea. Currently no chest pain, shortness of breath or other cardiovascular related symptoms noted.  Also has had low vitamin D. Taking scrip for supplement. Due for recheck level.  History George Kane has no past medical history on file.   He has no past surgical history on file.   His family history includes Cancer in his mother; Healthy in his brother and sister.He reports that he has never smoked. He has never used smokeless tobacco. He reports that he does not drink alcohol and does not use drugs.  Current Outpatient Medications on File Prior to Visit  Medication Sig Dispense Refill   atorvastatin (LIPITOR) 80 MG tablet Take 1 tablet (80 mg total) by mouth daily. 90 tablet 2   blood glucose meter kit and supplies KIT Dispense based on patient and insurance preference. Use up to four times daily as directed. (FOR ICD-10 : E11.9 1 each 11   Omega-3 Fatty Acids (FISH OIL CONCENTRATE PO) Take by mouth.     ONETOUCH VERIO test strip CHECK BLOOD SUGAR UP TO FOUR TIMES DAILY DX E11.9 400 strip 3   Vitamin D, Ergocalciferol, (DRISDOL) 1.25 MG (50000 UNIT) CAPS capsule Take 1 capsule (50,000 Units total) by mouth every 7 (seven) days. 13 capsule 1   No current facility-administered medications on file prior to visit.     ROS Review of Systems  Objective:  BP 139/80   Pulse 61   Temp 97.9 F (36.6 C)   Ht 5' 5.5" (1.664 m)   Wt 156 lb 3.2 oz (70.9 kg)   SpO2 96%   BMI 25.60 kg/m   BP Readings from Last 3 Encounters:  01/03/23 139/80  09/13/22 131/75  06/07/22 138/77    Wt Readings from Last 3 Encounters:  01/03/23 156 lb 3.2 oz (70.9 kg)  09/13/22 156 lb 6.4 oz (70.9 kg)  06/07/22 154 lb 9.6 oz (70.1 kg)     Physical Exam    Assessment & Plan:   George Kane was seen today for medical management of chronic issues.  Diagnoses and all orders for this visit:  Diabetes mellitus without complication -     Bayer DCA Hb A1c Waived -     CBC with Differential/Platelet -     CMP14+EGFR -     Microalbumin / creatinine urine ratio  Mixed hyperlipidemia -     Lipid panel  Vitamin D deficiency -     VITAMIN D 25 Hydroxy (Vit-D Deficiency, Fractures)  Other orders -     metFORMIN (GLUCOPHAGE-XR) 750 MG 24 hr tablet; Take 1 tablet (750 mg total) by mouth 2 (two) times daily. With breakfast and supper.      I have changed George Kane. George "[name]"'s metFORMIN. I am also having him maintain his blood glucose meter kit and supplies, Omega-3 Fatty Acids (FISH OIL CONCENTRATE PO), atorvastatin, Vitamin D (Ergocalciferol), and OneTouch Verio.  Meds ordered this  encounter  Medications   metFORMIN (GLUCOPHAGE-XR) 750 MG 24 hr tablet    Sig: Take 1 tablet (750 mg total) by mouth 2 (two) times daily. With breakfast and supper.    Dispense:  180 tablet    Refill:  3     Follow-up: Return in about 3 months (around 04/04/2023).  [name], M.D.

## 2023-01-04 LAB — CMP14+EGFR
ALT: 66 IU/L — ABNORMAL HIGH (ref 0–44)
AST: 54 IU/L — ABNORMAL HIGH (ref 0–40)
Albumin/Globulin Ratio: 1.9 (ref 1.2–2.2)
Albumin: 4.7 g/dL (ref 4.1–5.1)
Alkaline Phosphatase: 122 IU/L — ABNORMAL HIGH (ref 44–121)
BUN/Creatinine Ratio: 12 (ref 9–20)
BUN: 12 mg/dL (ref 6–24)
Bilirubin Total: 0.5 mg/dL (ref 0.0–1.2)
CO2: 23 mmol/L (ref 20–29)
Calcium: 9.5 mg/dL (ref 8.7–10.2)
Chloride: 102 mmol/L (ref 96–106)
Creatinine, Ser: 0.97 mg/dL (ref 0.76–1.27)
Globulin, Total: 2.5 g/dL (ref 1.5–4.5)
Glucose: 141 mg/dL — ABNORMAL HIGH (ref 70–99)
Potassium: 4 mmol/L (ref 3.5–5.2)
Sodium: 142 mmol/L (ref 134–144)
Total Protein: 7.2 g/dL (ref 6.0–8.5)
eGFR: 96 mL/min/{1.73_m2} (ref >59)

## 2023-01-04 LAB — MICROALBUMIN / CREATININE URINE RATIO
Creatinine, Urine: 156.8 mg/dL
Microalb/Creat Ratio: 7 mg/g creat (ref 0–29)
Microalbumin, Urine: 11.5 ug/mL

## 2023-01-04 LAB — LIPID PANEL
Chol/HDL Ratio: 3.3 ratio (ref 0.0–5.0)
Cholesterol, Total: 140 mg/dL (ref 100–199)
HDL: 43 mg/dL (ref >39)
LDL Chol Calc (NIH): 66 mg/dL (ref 0–99)
Triglycerides: 189 mg/dL — ABNORMAL HIGH (ref 0–149)
VLDL Cholesterol Cal: 31 mg/dL (ref 5–40)

## 2023-01-04 LAB — CBC WITH DIFFERENTIAL/PLATELET
Basophils Absolute: 0 10*3/uL (ref 0.0–0.2)
Basos: 0 %
EOS (ABSOLUTE): 0.1 10*3/uL (ref 0.0–0.4)
Eos: 1 %
Hematocrit: 42.7 % (ref 37.5–51.0)
Hemoglobin: 14.1 g/dL (ref 13.0–17.7)
Immature Grans (Abs): 0 10*3/uL (ref 0.0–0.1)
Immature Granulocytes: 0 %
Lymphocytes Absolute: 1.9 10*3/uL (ref 0.7–3.1)
Lymphs: 37 %
MCH: 27.6 pg (ref 26.6–33.0)
MCHC: 33 g/dL (ref 31.5–35.7)
MCV: 84 fL (ref 79–97)
Monocytes Absolute: 0.3 10*3/uL (ref 0.1–0.9)
Monocytes: 7 %
Neutrophils Absolute: 2.8 10*3/uL (ref 1.4–7.0)
Neutrophils: 55 %
Platelets: 279 10*3/uL (ref 150–450)
RBC: 5.11 x10E6/uL (ref 4.14–5.80)
RDW: 13.5 % (ref 11.6–15.4)
WBC: 5.1 10*3/uL (ref 3.4–10.8)

## 2023-01-05 LAB — SPECIMEN STATUS REPORT

## 2023-01-05 LAB — VITAMIN D 25 HYDROXY (VIT D DEFICIENCY, FRACTURES): Vit D, 25-Hydroxy: 37.2 ng/mL (ref 30.0–100.0)

## 2023-01-07 NOTE — Progress Notes (Signed)
Hello Nizar,  Your lab result is normal and/or stable.Some minor variations that are not significant are commonly marked abnormal, but do not represent any medical problem for you.  Best regards, Afton Mikelson, M.D.

## 2023-03-21 ENCOUNTER — Other Ambulatory Visit: Payer: Self-pay | Admitting: Family Medicine

## 2023-04-12 ENCOUNTER — Ambulatory Visit: Payer: 59 | Admitting: Family Medicine

## 2023-04-12 ENCOUNTER — Encounter: Payer: Self-pay | Admitting: Family Medicine

## 2023-04-12 VITALS — BP 134/80 | HR 60 | Temp 97.5°F | Ht 65.5 in | Wt 154.4 lb

## 2023-04-12 DIAGNOSIS — Z1211 Encounter for screening for malignant neoplasm of colon: Secondary | ICD-10-CM | POA: Diagnosis not present

## 2023-04-12 DIAGNOSIS — E782 Mixed hyperlipidemia: Secondary | ICD-10-CM

## 2023-04-12 DIAGNOSIS — E119 Type 2 diabetes mellitus without complications: Secondary | ICD-10-CM

## 2023-04-12 LAB — CMP14+EGFR
ALT: 57 IU/L — ABNORMAL HIGH (ref 0–44)
AST: 45 IU/L — ABNORMAL HIGH (ref 0–40)
Albumin: 4.8 g/dL (ref 4.1–5.1)
Alkaline Phosphatase: 119 IU/L (ref 44–121)
BUN/Creatinine Ratio: 18 (ref 9–20)
BUN: 13 mg/dL (ref 6–24)
Bilirubin Total: 0.5 mg/dL (ref 0.0–1.2)
CO2: 25 mmol/L (ref 20–29)
Calcium: 9.3 mg/dL (ref 8.7–10.2)
Chloride: 101 mmol/L (ref 96–106)
Creatinine, Ser: 0.74 mg/dL — ABNORMAL LOW (ref 0.76–1.27)
Globulin, Total: 2.1 g/dL (ref 1.5–4.5)
Glucose: 147 mg/dL — ABNORMAL HIGH (ref 70–99)
Potassium: 4.2 mmol/L (ref 3.5–5.2)
Sodium: 141 mmol/L (ref 134–144)
Total Protein: 6.9 g/dL (ref 6.0–8.5)
eGFR: 111 mL/min/{1.73_m2} (ref >59)

## 2023-04-12 LAB — CBC WITH DIFFERENTIAL/PLATELET
Basophils Absolute: 0 10*3/uL (ref 0.0–0.2)
Basos: 1 %
EOS (ABSOLUTE): 0.1 10*3/uL (ref 0.0–0.4)
Eos: 1 %
Hematocrit: 42.2 % (ref 37.5–51.0)
Hemoglobin: 13.9 g/dL (ref 13.0–17.7)
Immature Grans (Abs): 0 10*3/uL (ref 0.0–0.1)
Immature Granulocytes: 1 %
Lymphocytes Absolute: 2 10*3/uL (ref 0.7–3.1)
Lymphs: 36 %
MCH: 27.3 pg (ref 26.6–33.0)
MCHC: 32.9 g/dL (ref 31.5–35.7)
MCV: 83 fL (ref 79–97)
Monocytes Absolute: 0.4 10*3/uL (ref 0.1–0.9)
Monocytes: 7 %
Neutrophils Absolute: 3 10*3/uL (ref 1.4–7.0)
Neutrophils: 54 %
Platelets: 269 10*3/uL (ref 150–450)
RBC: 5.09 x10E6/uL (ref 4.14–5.80)
RDW: 13.7 % (ref 11.6–15.4)
WBC: 5.6 10*3/uL (ref 3.4–10.8)

## 2023-04-12 LAB — LIPID PANEL
Chol/HDL Ratio: 3.3 ratio (ref 0.0–5.0)
Cholesterol, Total: 125 mg/dL (ref 100–199)
HDL: 38 mg/dL — ABNORMAL LOW (ref >39)
LDL Chol Calc (NIH): 49 mg/dL (ref 0–99)
Triglycerides: 240 mg/dL — ABNORMAL HIGH (ref 0–149)
VLDL Cholesterol Cal: 38 mg/dL (ref 5–40)

## 2023-04-12 LAB — BAYER DCA HB A1C WAIVED: HB A1C (BAYER DCA - WAIVED): 7.7 % — ABNORMAL HIGH (ref 4.8–5.6)

## 2023-04-12 MED ORDER — DAPAGLIFLOZIN PROPANEDIOL 10 MG PO TABS
10.0000 mg | ORAL_TABLET | Freq: Every day | ORAL | 3 refills | Status: DC
Start: 2023-04-12 — End: 2024-04-04

## 2023-04-12 MED ORDER — ATORVASTATIN CALCIUM 80 MG PO TABS: 80.0000 mg | ORAL_TABLET | Freq: Every day | ORAL | 3 refills | Status: DC

## 2023-04-12 NOTE — Progress Notes (Unsigned)
Subjective:  Patient ID: George Kane, male    DOB: 03-04-1971  Age: 52 y.o. MRN: [id number]  CC: Medical Management of Chronic Issues   HPI George Kane presents for presents forFollow-up of diabetes. Patient checks blood sugar at home.   108 fasting and 154 postprandial Patient denies symptoms such as polyuria, polydipsia, excessive hunger, nausea No significant hypoglycemic spells noted. Medications reviewed. Pt reports taking them regularly without complication/adverse reaction being reported today.   Lab Results  Component Value Date   HGBA1C 7.7 (H) 04/12/2023   HGBA1C 7.9 (H) 01/03/2023   HGBA1C 7.3 (H) 09/13/2022         04/12/2023    8:35 AM 01/03/2023    9:36 AM 09/13/2022    8:03 AM  Depression screen PHQ 2/9  Decreased Interest 0 0 0  Down, Depressed, Hopeless 0 0 0  PHQ - 2 Score 0 0 0  Altered sleeping   1  Tired, decreased energy   0  Change in appetite   1  Feeling bad or failure about yourself    0  Trouble concentrating   0  Moving slowly or fidgety/restless   0  Suicidal thoughts   0  PHQ-9 Score   2  Difficult doing work/chores   Somewhat difficult    History Brantley has no past medical history on file.   He has no past surgical history on file.   His family history includes Cancer in his mother; Healthy in his brother and sister.He reports that he has never smoked. He has never used smokeless tobacco. He reports that he does not drink alcohol and does not use drugs.    ROS Review of Systems  Constitutional:  Negative for fever.  Respiratory:  Negative for shortness of breath.   Cardiovascular:  Negative for chest pain.  Musculoskeletal:  Negative for arthralgias.  Skin:  Negative for rash.    Objective:  BP 134/80   Pulse 60   Temp (!) 97.5 F (36.4 C)   Ht 5' 5.5" (1.664 m)   Wt 154 lb 6.4 oz (70 kg)   SpO2 97%   BMI 25.30 kg/m   BP Readings from Last 3 Encounters:  04/12/23 134/80  01/03/23 139/80  09/13/22  131/75    Wt Readings from Last 3 Encounters:  04/12/23 154 lb 6.4 oz (70 kg)  01/03/23 156 lb 3.2 oz (70.9 kg)  09/13/22 156 lb 6.4 oz (70.9 kg)     Physical Exam Vitals reviewed.  Constitutional:      Appearance: He is well-developed.  HENT:     Head: Normocephalic and atraumatic.     Right Ear: External ear normal.     Left Ear: External ear normal.     Mouth/Throat:     Pharynx: No oropharyngeal exudate or posterior oropharyngeal erythema.  Eyes:     Pupils: Pupils are equal, round, and reactive to light.  Cardiovascular:     Rate and Rhythm: Normal rate and regular rhythm.     Heart sounds: No murmur heard. Pulmonary:     Effort: No respiratory distress.     Breath sounds: Normal breath sounds.  Musculoskeletal:     Cervical back: Normal range of motion and neck supple.  Neurological:     Mental Status: He is alert and oriented to person, place, and time.       Assessment & Plan:   George "Onalee Hua" was seen today for medical management of chronic issues.  Diagnoses and  all orders for this visit:  Diabetes mellitus without complication ([facility]) -     Bayer DCA Hb A1c Waived -     CBC with Differential/Platelet -     CMP14+EGFR  Mixed hyperlipidemia -     Lipid panel  Screen for colon cancer -     Ambulatory referral to Gastroenterology  Other orders -     atorvastatin (LIPITOR) 80 MG tablet; Take 1 tablet (80 mg total) by mouth daily. -     dapagliflozin propanediol (FARXIGA) 10 MG TABS tablet; Take 1 tablet (10 mg total) by mouth daily before breakfast.       I have changed George Kane. George Frame "[name]"'s atorvastatin. I am also having him start on dapagliflozin propanediol. Additionally, I am having him maintain his blood glucose meter kit and supplies, Omega-3 Fatty Acids (FISH OIL CONCENTRATE PO), Vitamin D (Ergocalciferol), OneTouch Verio, and metFORMIN.  Allergies as of 04/12/2023       Reactions   Neosporin [neomycin-bacitracin Zn-polymyx] Itching         Medication List        Accurate as of April 12, 2023 11:59 PM. If you have any questions, ask your nurse or doctor.          atorvastatin 80 MG tablet Commonly known as: LIPITOR Take 1 tablet (80 mg total) by mouth daily.   blood glucose meter kit and supplies Kit Dispense based on patient and insurance preference. Use up to four times daily as directed. (FOR ICD-10 : E11.9   dapagliflozin propanediol 10 MG Tabs tablet Commonly known as: Farxiga Take 1 tablet (10 mg total) by mouth daily before breakfast. Started by: George Kane   FISH OIL CONCENTRATE PO Take by mouth.   metFORMIN 750 MG 24 hr tablet Commonly known as: GLUCOPHAGE-XR Take 1 tablet (750 mg total) by mouth 2 (two) times daily. With breakfast and supper.   OneTouch Verio test strip Generic drug: glucose blood CHECK BLOOD SUGAR UP TO FOUR TIMES DAILY DX E11.9   Vitamin D (Ergocalciferol) 1.25 MG (50000 UNIT) Caps capsule Commonly known as: DRISDOL Take 1 capsule (50,000 Units total) by mouth every 7 (seven) days.         Follow-up: Return in about 3 months (around 07/13/2023) for diabetes.  George Kane, M.D.

## 2023-04-14 ENCOUNTER — Encounter: Payer: Self-pay | Admitting: Family Medicine

## 2023-04-15 NOTE — Progress Notes (Signed)
Hello Averi,  Your lab result is normal and/or stable.Some minor variations that are not significant are commonly marked abnormal, but do not represent any medical problem for you.  Best regards, Warren Stacks, M.D.

## 2023-04-17 ENCOUNTER — Encounter: Payer: Self-pay | Admitting: *Deleted

## 2023-05-14 ENCOUNTER — Encounter: Payer: Self-pay | Admitting: Gastroenterology

## 2023-05-21 ENCOUNTER — Encounter (HOSPITAL_COMMUNITY): Payer: Self-pay | Admitting: Emergency Medicine

## 2023-05-21 ENCOUNTER — Emergency Department (HOSPITAL_COMMUNITY)
Admission: EM | Admit: 2023-05-21 | Discharge: 2023-05-21 | Disposition: A | Discharge status: Home / Self Care | Payer: 59 | Attending: Emergency Medicine | Admitting: Emergency Medicine

## 2023-05-21 ENCOUNTER — Emergency Department (HOSPITAL_COMMUNITY): Payer: 59

## 2023-05-21 DIAGNOSIS — S40812A Abrasion of left upper arm, initial encounter: Secondary | ICD-10-CM | POA: Diagnosis not present

## 2023-05-21 DIAGNOSIS — Z7984 Long term (current) use of oral hypoglycemic drugs: Secondary | ICD-10-CM | POA: Diagnosis not present

## 2023-05-21 DIAGNOSIS — M546 Pain in thoracic spine: Secondary | ICD-10-CM | POA: Insufficient documentation

## 2023-05-21 DIAGNOSIS — M791 Myalgia, unspecified site: Secondary | ICD-10-CM | POA: Insufficient documentation

## 2023-05-21 DIAGNOSIS — M7918 Myalgia, other site: Secondary | ICD-10-CM

## 2023-05-21 DIAGNOSIS — S4992XA Unspecified injury of left shoulder and upper arm, initial encounter: Secondary | ICD-10-CM | POA: Diagnosis present

## 2023-05-21 DIAGNOSIS — E119 Type 2 diabetes mellitus without complications: Secondary | ICD-10-CM | POA: Diagnosis not present

## 2023-05-21 DIAGNOSIS — S30811A Abrasion of abdominal wall, initial encounter: Secondary | ICD-10-CM | POA: Insufficient documentation

## 2023-05-21 DIAGNOSIS — Y92410 Unspecified street and highway as the place of occurrence of the external cause: Secondary | ICD-10-CM | POA: Insufficient documentation

## 2023-05-21 HISTORY — DX: Type 2 diabetes mellitus without complications: E11.9

## 2023-05-21 MED ORDER — IBUPROFEN 800 MG PO TABS
800.0000 mg | ORAL_TABLET | Freq: Once | ORAL | Status: AC
  Administered 2023-05-21: 800 mg via ORAL
  Filled 2023-05-21: qty 1
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
## 2023-05-21 NOTE — Discharge Instructions (Signed)
Take Robaxin and Naproxen as needed as prescribed. Do not drive or operate machinery if you are taking Robaxin (muscle relaxant).   You can apply warm compresses to sore muscles and follow with gentle stretching as discussed.  Recheck with your primary care provider if not improving after 5 day.
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
## 2023-05-31 NOTE — Progress Notes (Signed)
Referring Provider: [name], MD Primary Care Physician:  [name], MD Primary Gastroenterologist:  Dr. Jena Gauss  Chief Complaint  Patient presents with   Colonoscopy    Colonoscopy screening.     HPI:   George Kane is a 52 y.o. male presenting today at the request of  [name], MD for colon cancer screening.   He has never had a colonoscopy. No family history of colon cancer.  Couple episode of blood of bright red blood in toilet water about 1 month ago. No rectal pain. No constipation or diarrhea. No known hemorrhoids.   Has lost some weight intentionally with walking and watching what he eats.   No nausea, vomiting, reflux symptoms, or dysphagia.  Past Medical History:  Diagnosis Date   Diabetes mellitus without complication (HCC)    HLD (hyperlipidemia)     History reviewed. No pertinent surgical history.  Current Outpatient Medications  Medication Sig Dispense Refill   atorvastatin (LIPITOR) 80 MG tablet Take 1 tablet (80 mg total) by mouth daily. 90 tablet 3   blood glucose meter kit and supplies KIT Dispense based on patient and insurance preference. Use up to four times daily as directed. (FOR ICD-10 : E11.9 1 each 11   dapagliflozin propanediol (FARXIGA) 10 MG TABS tablet Take 1 tablet (10 mg total) by mouth daily before breakfast. 90 tablet 3   Krill Oil 500 MG CAPS Take 500 mg by mouth.     metFORMIN (GLUCOPHAGE-XR) 750 MG 24 hr tablet Take 1 tablet (750 mg total) by mouth 2 (two) times daily. With breakfast and supper. 180 tablet 3   Omega-3 Fatty Acids (FISH OIL CONCENTRATE PO) Take by mouth.     ONETOUCH VERIO test strip CHECK BLOOD SUGAR UP TO FOUR TIMES DAILY DX E11.9 400 strip 3   Vitamin D, Ergocalciferol, (DRISDOL) 1.25 MG (50000 UNIT) CAPS capsule Take 1 capsule (50,000 Units total) by mouth every 7 (seven) days. 13 capsule 1   No current facility-administered medications for this visit.    Allergies as of 06/01/2023 - Review  Complete 06/01/2023  Allergen Reaction Noted   Neosporin [neomycin-bacitracin zn-polymyx] Itching 09/10/2013    Family History  Problem Relation Age of Onset   Cancer Mother        breast   Healthy Sister    Healthy Brother    Colon cancer Neg Hx     Social History   Socioeconomic History   Marital status: Single    Spouse name: Not on file   Number of children: Not on file   Years of education: Not on file   Highest education level: Not on file  Occupational History   Not on file  Tobacco Use   Smoking status: Never   Smokeless tobacco: Never  Vaping Use   Vaping status: Never Used  Substance and Sexual Activity   Alcohol use: Yes    Comment: 3 beer a week.   Drug use: No   Sexual activity: Not on file  Other Topics Concern   Not on file  Social History Narrative   Not on file   Social Determinants of Health   Financial Resource Strain: Not on file  Food Insecurity: No Food Insecurity (03/07/2021)   Hunger Vital Sign    Worried About Running Out of Food in the Last Year: Never true    Ran Out of Food in the Last Year: Never true  Transportation Needs: Not on file  Physical Activity: Not on file  Stress: Not on file  Social Connections: Not on file  Intimate Partner Violence: Not on file    Review of Systems: Gen: Denies any fever, chills, cold or flulike symptoms, presyncope, syncope. CV: Denies chest pain, heart palpitations. Resp: Denies shortness of breath, cough. GI: See HPI GU : Denies urinary burning, urinary frequency, urinary hesitancy MS: Denies joint pain. Derm: Denies rash. Psych: Denies depression, anxiety. Heme: See HPI  Physical Exam: BP 139/88 (BP Location: Right Arm, Patient Position: Sitting, Cuff Size: Normal)   Pulse 62   Temp 98.2 F (36.8 C) (Temporal)   Ht 5\' 5"  (1.651 m)   Wt 153 lb 3.2 oz (69.5 kg)   SpO2 97%   BMI 25.49 kg/m  General:   Alert and oriented. Pleasant and cooperative. Well-nourished and well-developed.   Head:  Normocephalic and atraumatic. Eyes:  Without icterus, sclera clear and conjunctiva pink.  Ears:  Normal auditory acuity. Lungs:  Clear to auscultation bilaterally. No wheezes, rales, or rhonchi. No distress.  Heart:  S1, S2 present without murmurs appreciated.  Abdomen:  +BS, soft, non-tender and non-distended. No HSM noted. No guarding or rebound. No masses appreciated.  Rectal:  Deferred  Msk:  Symmetrical without gross deformities. Normal posture. Extremities:  Without edema. Neurologic:  Alert and  oriented x4;  grossly normal neurologically. Skin:  Intact without significant lesions or rashes. Psych: Normal mood and affect.    Assessment:  52 year old male with history of diabetes and hyperlipidemia, presenting today to discuss scheduling first-ever colonoscopy.  He does report a couple episodes of bright red blood per rectum with blood in toilet water about 1 month ago with no recurrent symptoms.  Denies rectal pain, constipation, diarrhea, known hemorrhoids, unintentional weight loss.  No known family history of colon cancer or colon polyps.  Reviewed recent labs 04/12/2023 with hemoglobin 13.9.  Etiology of his rectal bleeding is unclear.  May have been secondary to benign anorectal source, but unable to rule out colon polyps or malignancy.  We will proceed with colonoscopy in the near future.   *After patient left the office, I noticed he has chronic mild LFT elevation. No recent abdominal imaging. Recommend following up in the office after colonoscopy to discuss this further.   Plan:  Proceed with colonoscopy with propofol by Dr. Jena Gauss in near future. The risks, benefits, and alternatives have been discussed with the patient in detail. The patient states understanding and desires to proceed.  ASA 2 Hold Farxiga x 3 days prior to procedure One half dose of metformin 1 day prior to procedure No morning diabetes medications day of procedure Follow-up in office after  colonoscopy due to chronic LFT elevation.    Ermalinda Memos, PA-C [facility]enter[facility] 06/01/2023

## 2023-06-01 ENCOUNTER — Ambulatory Visit (INDEPENDENT_AMBULATORY_CARE_PROVIDER_SITE_OTHER): Payer: 59 | Admitting: Gastroenterology

## 2023-06-01 ENCOUNTER — Encounter: Payer: Self-pay | Admitting: *Deleted

## 2023-06-01 ENCOUNTER — Telehealth: Payer: Self-pay | Admitting: *Deleted

## 2023-06-01 ENCOUNTER — Encounter: Payer: Self-pay | Admitting: Gastroenterology

## 2023-06-01 ENCOUNTER — Other Ambulatory Visit: Payer: Self-pay | Admitting: *Deleted

## 2023-06-01 VITALS — BP 139/88 | HR 62 | Temp 98.2°F | Ht 65.0 in | Wt 153.2 lb

## 2023-06-01 DIAGNOSIS — K625 Hemorrhage of anus and rectum: Secondary | ICD-10-CM | POA: Diagnosis not present

## 2023-06-01 DIAGNOSIS — R7989 Other specified abnormal findings of blood chemistry: Secondary | ICD-10-CM | POA: Diagnosis not present

## 2023-06-01 MED ORDER — PEG 3350-KCL-NA BICARB-NACL 420 G PO SOLR: 4000.0000 mL | Freq: Once | ORAL | 0 refills | Status: AC

## 2023-06-01 NOTE — Patient Instructions (Addendum)
We will get you scheduled for a colonoscopy in the near future with Dr. Jena Gauss at [facility]. You will need to hold Farxiga for 3 days prior to your colonoscopy. 1 day prior to colonoscopy: Hold your evening dose of metformin. Day of colonoscopy: Do not take any morning diabetes medications.  We will follow-up with you in the office as needed.  It was nice to meet you today!  Ermalinda Memos, PA-C [facility]astroenter[facility]

## 2023-06-01 NOTE — Telephone Encounter (Signed)
[employer] PA:  CPT Code GECOL Description: Colonoscopy Case Number: [id number] Review Date: 06/01/2023 9:10:58 AM Expiration Date: N/A Status: This member is not in scope for prior-authorization/notification for the services requested. You can save the case reference ID as validation of your request.

## 2023-06-04 ENCOUNTER — Telehealth: Payer: Self-pay | Admitting: Internal Medicine

## 2023-06-04 NOTE — Telephone Encounter (Signed)
Pt cancelled his procedure for 06/25/23. He wants to be scheduled in October and have an early morning appt. Advised pt that will call him once we get providers schedule for October. Pt verbalized understanding.

## 2023-06-04 NOTE — Telephone Encounter (Signed)
Patient left a message saying he needed to reschedule the 9/30 appt for his procedure.  [phone]

## 2023-06-07 ENCOUNTER — Encounter: Payer: Self-pay | Admitting: *Deleted

## 2023-06-07 NOTE — Telephone Encounter (Signed)
Pt has been rescheduled to 07/02/23. Updated instructions mailed to pt.

## 2023-06-08 ENCOUNTER — Ambulatory Visit: Payer: 59 | Admitting: Gastroenterology

## 2023-06-25 ENCOUNTER — Ambulatory Visit (HOSPITAL_COMMUNITY): Admit: 2023-06-25 | Payer: 59 | Admitting: Internal Medicine

## 2023-06-25 ENCOUNTER — Encounter (HOSPITAL_COMMUNITY): Payer: Self-pay

## 2023-06-25 SURGERY — COLONOSCOPY WITH PROPOFOL
Anesthesia: Monitor Anesthesia Care

## 2023-07-05 ENCOUNTER — Other Ambulatory Visit: Payer: Self-pay

## 2023-07-05 ENCOUNTER — Ambulatory Visit (HOSPITAL_BASED_OUTPATIENT_CLINIC_OR_DEPARTMENT_OTHER): Payer: 59 | Admitting: Certified Registered"

## 2023-07-05 ENCOUNTER — Encounter (HOSPITAL_COMMUNITY): Payer: Self-pay | Admitting: Internal Medicine

## 2023-07-05 ENCOUNTER — Ambulatory Visit (HOSPITAL_COMMUNITY)
Admission: RE | Admit: 2023-07-05 | Discharge: 2023-07-05 | Disposition: A | Discharge status: Home / Self Care | Payer: 59 | Attending: Internal Medicine | Admitting: Internal Medicine

## 2023-07-05 ENCOUNTER — Encounter (HOSPITAL_COMMUNITY)
Admission: RE | Disposition: A | Discharge status: Home / Self Care | Payer: Self-pay | Source: Home / Self Care | Attending: Internal Medicine

## 2023-07-05 ENCOUNTER — Ambulatory Visit (HOSPITAL_COMMUNITY): Payer: 59 | Admitting: Certified Registered"

## 2023-07-05 DIAGNOSIS — K640 First degree hemorrhoids: Secondary | ICD-10-CM | POA: Diagnosis not present

## 2023-07-05 DIAGNOSIS — E785 Hyperlipidemia, unspecified: Secondary | ICD-10-CM | POA: Insufficient documentation

## 2023-07-05 DIAGNOSIS — Z7984 Long term (current) use of oral hypoglycemic drugs: Secondary | ICD-10-CM | POA: Diagnosis not present

## 2023-07-05 DIAGNOSIS — K5730 Diverticulosis of large intestine without perforation or abscess without bleeding: Secondary | ICD-10-CM

## 2023-07-05 DIAGNOSIS — K5900 Constipation, unspecified: Secondary | ICD-10-CM | POA: Diagnosis not present

## 2023-07-05 DIAGNOSIS — E119 Type 2 diabetes mellitus without complications: Secondary | ICD-10-CM | POA: Diagnosis not present

## 2023-07-05 DIAGNOSIS — K921 Melena: Secondary | ICD-10-CM | POA: Insufficient documentation

## 2023-07-05 DIAGNOSIS — K625 Hemorrhage of anus and rectum: Secondary | ICD-10-CM | POA: Diagnosis not present

## 2023-07-05 DIAGNOSIS — Z79899 Other long term (current) drug therapy: Secondary | ICD-10-CM | POA: Insufficient documentation

## 2023-07-05 HISTORY — PX: COLONOSCOPY WITH PROPOFOL: SHX5780

## 2023-07-05 LAB — GLUCOSE, CAPILLARY: Glucose-Capillary: 89 mg/dL (ref 70–99)

## 2023-07-05 SURGERY — COLONOSCOPY WITH PROPOFOL
Anesthesia: General

## 2023-07-05 MED ORDER — PROPOFOL 500 MG/50ML IV EMUL
INTRAVENOUS | Status: DC | PRN
  Administered 2023-07-05: 150 ug/kg/min via INTRAVENOUS

## 2023-07-05 MED ORDER — PROPOFOL 10 MG/ML IV BOLUS
INTRAVENOUS | Status: DC | PRN
  Administered 2023-07-05: 100 mg via INTRAVENOUS

## 2023-07-05 MED ORDER — LACTATED RINGERS IV SOLN
INTRAVENOUS | Status: DC | PRN
Start: 2023-07-05 — End: 2023-07-05

## 2023-07-05 MED ORDER — LIDOCAINE HCL (CARDIAC) PF 100 MG/5ML IV SOSY
PREFILLED_SYRINGE | INTRAVENOUS | Status: DC | PRN
  Administered 2023-07-05: 50 mg via INTRAVENOUS

## 2023-07-05 NOTE — Transfer of Care (Signed)
Immediate Anesthesia Transfer of Care Note  Patient: George Kane  Procedure(s) Performed: COLONOSCOPY WITH PROPOFOL  Patient Location: Endoscopy Unit  Anesthesia Type:General  Level of Consciousness: drowsy  Airway & Oxygen Therapy: Patient Spontanous Breathing  Post-op Assessment: Report given to RN and Post -op Vital signs reviewed and stable  Post vital signs: Reviewed and stable  Last Vitals:  Vitals Value Taken Time  BP    Temp    Pulse    Resp    SpO2      Last Pain:  Vitals:   07/05/23 0738  TempSrc:   PainSc: 0-No pain      Patients Stated Pain Goal: 8 (07/05/23 0708)  Complications: No notable events documented.

## 2023-07-05 NOTE — Anesthesia Procedure Notes (Signed)
Date/Time: 07/05/2023 7:40 AM  Performed by: Julian Reil, CRNAPre-anesthesia Checklist: Patient identified, Emergency Drugs available, Suction available and Patient being monitored Patient Re-evaluated:Patient Re-evaluated prior to induction Oxygen Delivery Method: Nasal cannula Induction Type: IV induction Placement Confirmation: positive ETCO2

## 2023-07-05 NOTE — Anesthesia Preprocedure Evaluation (Signed)
Anesthesia Evaluation  Patient identified by MRN, date of birth, ID band Patient awake    Reviewed: Allergy & Precautions, H&P , NPO status , Patient's Chart, lab work & pertinent test results, reviewed documented beta blocker date and time   Airway Mallampati: II  TM Distance: >3 FB Neck ROM: full    Dental no notable dental hx.    Pulmonary neg pulmonary ROS   Pulmonary exam normal breath sounds clear to auscultation       Cardiovascular Exercise Tolerance: Good negative cardio ROS  Rhythm:regular Rate:Normal     Neuro/Psych negative neurological ROS  negative psych ROS   GI/Hepatic negative GI ROS, Neg liver ROS,,,  Endo/Other  negative endocrine ROSdiabetes    Renal/GU negative Renal ROS  negative genitourinary   Musculoskeletal   Abdominal   Peds  Hematology negative hematology ROS (+)   Anesthesia Other Findings   Reproductive/Obstetrics negative OB ROS                             Anesthesia Physical Anesthesia Plan  ASA: 2  Anesthesia Plan: General   Post-op Pain Management:    Induction:   PONV Risk Score and Plan: Propofol infusion  Airway Management Planned:   Additional Equipment:   Intra-op Plan:   Post-operative Plan:   Informed Consent: I have reviewed the patients History and Physical, chart, labs and discussed the procedure including the risks, benefits and alternatives for the proposed anesthesia with the patient or authorized representative who has indicated his/her understanding and acceptance.     Dental Advisory Given  Plan Discussed with: CRNA  Anesthesia Plan Comments:        Anesthesia Quick Evaluation  

## 2023-07-05 NOTE — H&P (Signed)
@LOGO @   Primary Care Physician:  [name], MD Primary Gastroenterologist:  Dr. Jena Gauss  Pre-Procedure History & Physical: HPI:  George Kane is a 52 y.o. male here for colonoscopy.  Occasional hematochezia when constipated.  No family history of colon cancer.  No prior colonoscopy.  Past Medical History:  Diagnosis Date   Diabetes mellitus without complication (HCC)    HLD (hyperlipidemia)     History reviewed. No pertinent surgical history.  Prior to Admission medications   Medication Sig Start Date End Date Taking? Authorizing Provider  atorvastatin (LIPITOR) 80 MG tablet Take 1 tablet (80 mg total) by mouth daily. 04/12/23   [name], MD  blood glucose meter kit and supplies KIT Dispense based on patient and insurance preference. Use up to four times daily as directed. (FOR ICD-10 : E11.9 12/08/20   [name], MD  dapagliflozin propanediol (FARXIGA) 10 MG TABS tablet Take 1 tablet (10 mg total) by mouth daily before breakfast. 04/12/23   [name], MD  [name] Oil 500 MG CAPS Take 500 mg by mouth.    [provider]  metFORMIN (GLUCOPHAGE-XR) 750 MG 24 hr tablet Take 1 tablet (750 mg total) by mouth 2 (two) times daily. With breakfast and supper. 01/03/23   [name], MD  Omega-3 Fatty Acids (FISH OIL CONCENTRATE PO) Take by mouth.    [provider]  ONETOUCH VERIO test strip CHECK BLOOD SUGAR UP TO FOUR TIMES DAILY DX E11.9 12/18/22   [name], MD  Vitamin D, Ergocalciferol, (DRISDOL) 1.25 MG (50000 UNIT) CAPS capsule Take 1 capsule (50,000 Units total) by mouth every 7 (seven) days. 06/27/22   [name], MD    Allergies as of 06/07/2023 - Review Complete 06/01/2023  Allergen Reaction Noted   Neosporin [neomycin-bacitracin zn-polymyx] Itching 09/10/2013    Family History  Problem Relation Age of Onset   Cancer Mother        breast   Healthy Sister    Healthy Brother    Colon cancer Neg Hx     Social History    Socioeconomic History   Marital status: Single    Spouse name: Not on file   Number of children: Not on file   Years of education: Not on file   Highest education level: Not on file  Occupational History   Not on file  Tobacco Use   Smoking status: Never   Smokeless tobacco: Never  Vaping Use   Vaping status: Never Used  Substance and Sexual Activity   Alcohol use: Yes    Comment: 3 beer a week.   Drug use: No   Sexual activity: Not on file  Other Topics Concern   Not on file  Social History Narrative   Not on file   Social Determinants of Health   Financial Resource Strain: Not on file  Food Insecurity: No Food Insecurity (03/07/2021)   Hunger Vital Sign    Worried About Running Out of Food in the Last Year: Never true    Ran Out of Food in the Last Year: Never true  Transportation Needs: Not on file  Physical Activity: Not on file  Stress: Not on file  Social Connections: Not on file  Intimate Partner Violence: Not on file    Review of Systems: See HPI, otherwise negative ROS  Physical Exam: BP (!) 155/85   Pulse 67   Temp 98.2 F (36.8 C) (Oral)   Resp 17   Ht 5\' 5"  (1.651 m)  Wt 66.2 kg   SpO2 99%   BMI 24.30 kg/m  General:   Alert,  Well-developed, well-nourished, pleasant and cooperative in NAD Skin:  Intact without significant lesions or rashes. Eyes:  Sclera clear, no icterus.   Conjunctiva pink. Ears:  Normal auditory acuity. Nose:  No deformity, discharge,  or lesions. Mouth:  No deformity or lesions. Neck:  Supple; no masses or thyromegaly. No significant cervical adenopathy. Lungs:  Clear throughout to auscultation.   No wheezes, crackles, or rhonchi. No acute distress. Heart:  Regular rate and rhythm; no murmurs, clicks, rubs,  or gallops. Abdomen: Non-distended, normal bowel sounds.  Soft and nontender without appreciable mass or hepatosplenomegaly.  Pulses:  Normal pulses noted. Extremities:  Without clubbing or  edema.  Impression/Plan: 52 year old gentleman here for a colonoscopy intermittent hematochezia. The risks, benefits, limitations, alternatives and imponderables have been reviewed with the patient. Questions have been answered. All parties are agreeable.       Notice: This dictation was prepared with Dragon dictation along with smaller phrase technology. Any transcriptional errors that result from this process are unintentional and may not be corrected upon review.

## 2023-07-05 NOTE — Op Note (Addendum)
[facility] Patient Name: George Kane Procedure Date: 07/05/2023 6:53 AM MRN: [id number] Date of Birth: [birth year] Attending MD: Gennette Pac , MD, [id number] CSN: [id number] Age: 52 Admit Type: Outpatient Procedure:                Colonoscopy Indications:              Hematochezia Providers:                Gennette Pac, MD, Buel Ream. Thomasena Edis RN, RN,                            Pandora Leiter, Technician Referring MD:             Gennette Pac, MD Medicines:                Propofol per Anesthesia Complications:            No immediate complications. Estimated Blood Loss:     Estimated blood loss: none. Procedure:                Pre-Anesthesia Assessment:                           - Prior to the procedure, a History and Physical                            was performed, and patient medications and                            allergies were reviewed. The patient's tolerance of                            previous anesthesia was also reviewed. The risks                            and benefits of the procedure and the sedation                            options and risks were discussed with the patient.                            All questions were answered, and informed consent                            was obtained. Prior Anticoagulants: The patient has                            taken no anticoagulant or antiplatelet agents. ASA                            Grade Assessment: II - A patient with mild systemic                            disease. After reviewing the risks and benefits,  the patient was deemed in satisfactory condition to                            undergo the procedure.                           After obtaining informed consent, the colonoscope                            was passed under direct vision. Throughout the                            procedure, the patient's blood pressure, pulse, and                             oxygen saturations were monitored continuously. The                            [phone]) scope was introduced through                            the anus and advanced to the the cecum, identified                            by appendiceal orifice and ileocecal valve. The                            colonoscopy was performed with ease. The patient                            tolerated the procedure well. The quality of the                            bowel preparation was adequate. The ileocecal                            valve, appendiceal orifice, and rectum were                            photographed. Scope In: 7:39:59 AM Scope Out: 7:53:22 AM Scope Withdrawal Time: 0 hours 7 minutes 52 seconds  Total Procedure Duration: 0 hours 13 minutes 23 seconds  Findings:      The perianal and digital rectal examinations were normal. Vegetable       matter throughout the colon. Precluded complete examination. Preparation       and adequate. All of the colon was not seen.      Scattered medium-mouthed diverticula were found in the entire colon.      Non-bleeding internal hemorrhoids were found during retroflexion. The       hemorrhoids were mild, medium-sized and Grade I (internal hemorrhoids       that do not prolapse). Impression:               - Diverticulosis in the entire examined colon.                           -  Non-bleeding internal hemorrhoids.                           -Inadequate preparation precluded complete                            examination. Moderate Sedation:      Moderate (conscious) sedation was personally administered by an       anesthesia professional. The following parameters were monitored: oxygen       saturation, heart rate, blood pressure, respiratory rate, EKG, adequacy       of pulmonary ventilation, and response to care. Recommendation:           - Patient has a contact number available for                            emergencies. The signs and symptoms  of potential                            delayed complications were discussed with the                            patient. Return to normal activities tomorrow.                            Written discharge instructions were provided to the                            patient.                           - Advance diet as tolerated. Begin Benefiber daily                            as specified in discharge instructions. Office                            visit with George Kane in 6 weeks. Recommend                            repeat colonoscopy for complete examination after                            he is seen in the office. Procedure Code(s):        --- Professional ---                           [phone], Colonoscopy, flexible; diagnostic, including                            collection of specimen(s) by brushing or washing,                            when performed (separate procedure) Diagnosis Code(s):        --- Professional ---  K64.0, First degree hemorrhoids                           K92.1, Melena (includes Hematochezia)                           K57.30, Diverticulosis of large intestine without                            perforation or abscess without bleeding CPT copyright 2022 American Medical Association. All rights reserved. The codes documented in this report are preliminary and upon coder review may  be revised to meet current compliance requirements. Gerrit Friends. Virdia Ziesmer, MD Gennette Pac, MD 07/05/2023 8:54:10 AM This report has been signed electronically. Number of Addenda: 0

## 2023-07-05 NOTE — Anesthesia Postprocedure Evaluation (Signed)
Anesthesia Post Note  Patient: George Kane  Procedure(s) Performed: COLONOSCOPY WITH PROPOFOL  Patient location during evaluation: Phase II Anesthesia Type: General Level of consciousness: awake Pain management: pain level controlled Vital Signs Assessment: post-procedure vital signs reviewed and stable Respiratory status: spontaneous breathing and respiratory function stable Cardiovascular status: blood pressure returned to baseline and stable Postop Assessment: no headache and no apparent nausea or vomiting Anesthetic complications: no Comments: Late entry   No notable events documented.   Last Vitals:  Vitals:   07/05/23 0708 07/05/23 0755  BP: (!) 155/85 111/70  Pulse: 67 71  Resp: 17 18  Temp: 36.8 C 36.5 C  SpO2: 99% 97%    Last Pain:  Vitals:   07/05/23 0755  TempSrc: Oral  PainSc: 0-No pain                 [name]

## 2023-07-05 NOTE — Discharge Instructions (Addendum)
  Colonoscopy Discharge Instructions  Read the instructions outlined below and refer to this sheet in the next few weeks. These discharge instructions provide you with general information on caring for yourself after you leave the hospital. Your doctor may also give you specific instructions. While your treatment has been planned according to the most current medical practices available, unavoidable complications occasionally occur. If you have any problems or questions after discharge, call Dr. Jena Gauss at [phone]. ACTIVITY You may resume your regular activity, but move at a slower pace for the next 24 hours.  Take frequent rest periods for the next 24 hours.  Walking will help get rid of the air and reduce the bloated feeling in your belly (abdomen).  No driving for 24 hours (because of the medicine (anesthesia) used during the test).   Do not sign any important legal documents or operate any machinery for 24 hours (because of the anesthesia used during the test).  NUTRITION Drink plenty of fluids.  You may resume your normal diet as instructed by your doctor.  Begin with a light meal and progress to your normal diet. Heavy or fried foods are harder to digest and may make you feel sick to your stomach (nauseated).  Avoid alcoholic beverages for 24 hours or as instructed.  MEDICATIONS You may resume your normal medications unless your doctor tells you otherwise.  WHAT YOU CAN EXPECT TODAY Some feelings of bloating in the abdomen.  Passage of more gas than usual.  Spotting of blood in your stool or on the toilet paper.  IF YOU HAD POLYPS REMOVED DURING THE COLONOSCOPY: No aspirin products for 7 days or as instructed.  No alcohol for 7 days or as instructed.  Eat a soft diet for the next 24 hours.  FINDING OUT THE RESULTS OF YOUR TEST Not all test results are available during your visit. If your test results are not back during the visit, make an appointment with your caregiver to find out the  results. Do not assume everything is normal if you have not heard from your caregiver or the medical facility. It is important for you to follow up on all of your test results.  SEEK IMMEDIATE MEDICAL ATTENTION IF: You have more than a spotting of blood in your stool.  Your belly is swollen (abdominal distention).  You are nauseated or vomiting.  You have a temperature over 101.  You have abdominal pain or discomfort that is severe or gets worse throughout the day.     Your colon preparation was inadequate (I could not see all of your colon today)  You do have diverticulosis.  You do have hemorrhoids.  Begin Benefiber 1 tablespoon daily for 3 weeks; then increase to 2 tablespoons daily thereafter  Office visit with Ermalinda Memos in 6 weeks.  You will need a repeat colonoscopy later this year.  At patient request, I called Hanna Aultman at [phone] -reviewed findings and recommendations

## 2023-07-13 ENCOUNTER — Encounter (HOSPITAL_COMMUNITY): Payer: Self-pay | Admitting: Internal Medicine

## 2023-07-18 ENCOUNTER — Ambulatory Visit: Payer: 59 | Admitting: Family Medicine

## 2023-07-26 ENCOUNTER — Telehealth: Payer: Self-pay | Admitting: Family Medicine

## 2023-07-26 NOTE — Telephone Encounter (Signed)
  Prescription Request  07/26/2023  Is this a "Controlled Substance" medicine? no  Have you seen your PCP in the last 2 weeks? no  If YES, route message to pool  -  If NO, patient needs to be scheduled for appointment.  What is the name of the medication or equipment? Dapagliflozin propanediol 10 mg - Patient has appt 11-20 with [facility]  Have you contacted your pharmacy to request a refill? NO   Which [facility] [place]   Patient notified that their request is being sent to the clinical staff for review and that they should receive a response within 2 business days.

## 2023-07-26 NOTE — Telephone Encounter (Signed)
Informed pt that at July visit PCP [facility] refills should be on file w/ pharmacy, he will check w/ them.

## 2023-08-15 ENCOUNTER — Ambulatory Visit: Payer: 59 | Admitting: *Deleted

## 2023-08-15 ENCOUNTER — Ambulatory Visit: Payer: 59 | Admitting: Family Medicine

## 2023-08-15 ENCOUNTER — Encounter: Payer: Self-pay | Admitting: Family Medicine

## 2023-08-15 VITALS — BP 108/65 | HR 58 | Temp 97.0°F | Ht 65.0 in | Wt 150.6 lb

## 2023-08-15 DIAGNOSIS — E782 Mixed hyperlipidemia: Secondary | ICD-10-CM

## 2023-08-15 DIAGNOSIS — E119 Type 2 diabetes mellitus without complications: Secondary | ICD-10-CM

## 2023-08-15 DIAGNOSIS — Z7984 Long term (current) use of oral hypoglycemic drugs: Secondary | ICD-10-CM

## 2023-08-15 LAB — HM DIABETES EYE EXAM

## 2023-08-15 LAB — BAYER DCA HB A1C WAIVED: HB A1C (BAYER DCA - WAIVED): 7 % — ABNORMAL HIGH (ref 4.8–5.6)

## 2023-08-15 NOTE — Progress Notes (Unsigned)
George Kane arrived 08/15/2023 and has given verbal consent to obtain images and complete their overdue diabetic retinal screening.  The images have been sent to an ophthalmologist or optometrist for review and interpretation.  Results will be sent back to [name], MD for review.  Patient has been informed they will be contacted when we receive the results via telephone or MyChart

## 2023-08-15 NOTE — Progress Notes (Signed)
Subjective:  Patient ID: George Kane, male    DOB: 24-Nov-1970  Age: 52 y.o. MRN: [id number]  CC: Medical Management of Chronic Issues   HPI George Kane presents for Follow-up of diabetes. Patient checks blood sugar at home.  States glucose doing good. Getting regular exercise. Weight conscious. Just returned from vacation in [place] and [place].  Patient denies symptoms such as polyuria, polydipsia, excessive hunger, nausea No significant hypoglycemic spells noted. Medications reviewed. Pt reports taking them regularly without complication/adverse reaction being reported today.   Colonoscopy showed inadequate prep. I encourage him to check in with Dr. Kendell Bane about getting a repeat with more extensive prep.  Due for DM eye exam - to be done here today.   History George Kane has a past medical history of Diabetes mellitus without complication (HCC) and HLD (hyperlipidemia).   He has a past surgical history that includes Colonoscopy with propofol (N/A, 07/05/2023).   His family history includes Cancer in his mother; Healthy in his brother and sister.He reports that he has never smoked. He has never used smokeless tobacco. He reports current alcohol use. He reports that he does not use drugs.  Current Outpatient Medications on File Prior to Visit  Medication Sig Dispense Refill   atorvastatin (LIPITOR) 80 MG tablet Take 1 tablet (80 mg total) by mouth daily. 90 tablet 3   blood glucose meter kit and supplies KIT Dispense based on patient and insurance preference. Use up to four times daily as directed. (FOR ICD-10 : E11.9 1 each 11   dapagliflozin propanediol (FARXIGA) 10 MG TABS tablet Take 1 tablet (10 mg total) by mouth daily before breakfast. 90 tablet 3   Krill Oil 500 MG CAPS Take 500 mg by mouth.     metFORMIN (GLUCOPHAGE-XR) 750 MG 24 hr tablet Take 1 tablet (750 mg total) by mouth 2 (two) times daily. With breakfast and supper. 180 tablet 3   Omega-3 Fatty  Acids (FISH OIL CONCENTRATE PO) Take by mouth.     ONETOUCH VERIO test strip CHECK BLOOD SUGAR UP TO FOUR TIMES DAILY DX E11.9 400 strip 3   Vitamin D, Ergocalciferol, (DRISDOL) 1.25 MG (50000 UNIT) CAPS capsule Take 1 capsule (50,000 Units total) by mouth every 7 (seven) days. 13 capsule 1   No current facility-administered medications on file prior to visit.    ROS Review of Systems  Constitutional:  Negative for fever.  Respiratory:  Negative for shortness of breath.   Cardiovascular:  Negative for chest pain.  Musculoskeletal:  Negative for arthralgias.  Skin:  Negative for rash.    Objective:  BP 108/65   Pulse (!) 58   Temp (!) 97 F (36.1 C)   Ht 5\' 5"  (1.651 m)   Wt 150 lb 9.6 oz (68.3 kg)   SpO2 96%   BMI 25.06 kg/m   BP Readings from Last 3 Encounters:  08/15/23 108/65  07/05/23 111/70  06/01/23 139/88    Wt Readings from Last 3 Encounters:  08/15/23 150 lb 9.6 oz (68.3 kg)  07/05/23 146 lb (66.2 kg)  06/01/23 153 lb 3.2 oz (69.5 kg)     Physical Exam Vitals reviewed.  Constitutional:      Appearance: He is well-developed.  HENT:     Head: Normocephalic and atraumatic.     Right Ear: External ear normal.     Left Ear: External ear normal.     Mouth/Throat:     Pharynx: No oropharyngeal exudate or posterior oropharyngeal erythema.  Eyes:     Pupils: Pupils are equal, round, and reactive to light.  Cardiovascular:     Rate and Rhythm: Normal rate and regular rhythm.     Heart sounds: No murmur heard. Pulmonary:     Effort: No respiratory distress.     Breath sounds: Normal breath sounds.  Musculoskeletal:     Cervical back: Normal range of motion and neck supple.  Neurological:     Mental Status: He is alert and oriented to person, place, and time.       Assessment & Plan:   George "Onalee Hua" was seen today for medical management of chronic issues.  Diagnoses and all orders for this visit:  Diabetes mellitus without complication (HCC) -      CBC with Differential/Platelet -     CMP14+EGFR -     Bayer DCA Hb A1c Waived  Mixed hyperlipidemia -     Lipid panel      I am having George Kane. George Frame "[name]" maintain his blood glucose meter kit and supplies, Omega-3 Fatty Acids (FISH OIL CONCENTRATE PO), Vitamin D (Ergocalciferol), OneTouch Verio, metFORMIN, atorvastatin, dapagliflozin propanediol, and Krill Oil.  No orders of the defined types were placed in this encounter.    Follow-up: Return in about 3 months (around 11/15/2023).  George Kane, M.D.

## 2023-08-16 LAB — CBC WITH DIFFERENTIAL/PLATELET
Basophils Absolute: 0 10*3/uL (ref 0.0–0.2)
Basos: 0 %
EOS (ABSOLUTE): 0.1 10*3/uL (ref 0.0–0.4)
Eos: 1 %
Hematocrit: 42.7 % (ref 37.5–51.0)
Hemoglobin: 13.9 g/dL (ref 13.0–17.7)
Immature Grans (Abs): 0 10*3/uL (ref 0.0–0.1)
Immature Granulocytes: 0 %
Lymphocytes Absolute: 2 10*3/uL (ref 0.7–3.1)
Lymphs: 34 %
MCH: 27.4 pg (ref 26.6–33.0)
MCHC: 32.6 g/dL (ref 31.5–35.7)
MCV: 84 fL (ref 79–97)
Monocytes Absolute: 0.4 10*3/uL (ref 0.1–0.9)
Monocytes: 7 %
Neutrophils Absolute: 3.3 10*3/uL (ref 1.4–7.0)
Neutrophils: 58 %
Platelets: 273 10*3/uL (ref 150–450)
RBC: 5.07 x10E6/uL (ref 4.14–5.80)
RDW: 13.7 % (ref 11.6–15.4)
WBC: 5.8 10*3/uL (ref 3.4–10.8)

## 2023-08-16 LAB — CMP14+EGFR
ALT: 37 [IU]/L (ref 0–44)
AST: 34 IU/L (ref 0–40)
Albumin: 4.5 g/dL (ref 4.1–5.1)
Alkaline Phosphatase: 106 IU/L (ref 44–121)
BUN/Creatinine Ratio: 15 (ref 9–20)
BUN: 13 mg/dL (ref 6–24)
Bilirubin Total: 0.4 mg/dL (ref 0.0–1.2)
CO2: 24 mmol/L (ref 20–29)
Calcium: 9.6 mg/dL (ref 8.7–10.2)
Chloride: 102 mmol/L (ref 96–106)
Creatinine, Ser: 0.85 mg/dL (ref 0.76–1.27)
Globulin, Total: 2.5 g/dL (ref 1.5–4.5)
Glucose: 103 mg/dL — ABNORMAL HIGH (ref 70–99)
Potassium: 4.1 mmol/L (ref 3.5–5.2)
Sodium: 142 mmol/L (ref 134–144)
Total Protein: 7 g/dL (ref 6.0–8.5)
eGFR: 107 mL/min/{1.73_m2} (ref >59)

## 2023-08-16 LAB — LIPID PANEL
Chol/HDL Ratio: 3.1 ratio (ref 0.0–5.0)
Cholesterol, Total: 135 mg/dL (ref 100–199)
HDL: 43 mg/dL
LDL Chol Calc (NIH): 68 mg/dL (ref 0–99)
Triglycerides: 134 mg/dL (ref 0–149)
VLDL Cholesterol Cal: 24 mg/dL (ref 5–40)

## 2023-08-16 NOTE — Progress Notes (Signed)
Hello George Kane,  Your lab result is normal and/or stable.Some minor variations that are not significant are commonly marked abnormal, but do not represent any medical problem for you.  Best regards, Lanice Folden, M.D.

## 2023-10-07 ENCOUNTER — Other Ambulatory Visit: Payer: Self-pay | Admitting: Family Medicine

## 2023-11-15 ENCOUNTER — Ambulatory Visit: Payer: 59 | Admitting: Family Medicine

## 2023-12-10 ENCOUNTER — Telehealth: Payer: Self-pay | Admitting: Family Medicine

## 2023-12-11 ENCOUNTER — Encounter: Payer: Self-pay | Admitting: Family Medicine

## 2023-12-11 ENCOUNTER — Other Ambulatory Visit: Payer: 59

## 2023-12-11 ENCOUNTER — Ambulatory Visit (INDEPENDENT_AMBULATORY_CARE_PROVIDER_SITE_OTHER): Payer: 59 | Admitting: Family Medicine

## 2023-12-11 VITALS — BP 125/76 | HR 98 | Temp 97.7°F | Ht 65.0 in | Wt 152.0 lb

## 2023-12-11 DIAGNOSIS — E119 Type 2 diabetes mellitus without complications: Secondary | ICD-10-CM

## 2023-12-11 DIAGNOSIS — E756 Lipid storage disorder, unspecified: Secondary | ICD-10-CM

## 2023-12-11 DIAGNOSIS — E1169 Type 2 diabetes mellitus with other specified complication: Secondary | ICD-10-CM | POA: Diagnosis not present

## 2023-12-11 DIAGNOSIS — E782 Mixed hyperlipidemia: Secondary | ICD-10-CM

## 2023-12-11 DIAGNOSIS — Z7984 Long term (current) use of oral hypoglycemic drugs: Secondary | ICD-10-CM | POA: Diagnosis not present

## 2023-12-11 LAB — BAYER DCA HB A1C WAIVED: HB A1C (BAYER DCA - WAIVED): 6.5 % — ABNORMAL HIGH (ref 4.8–5.6)

## 2023-12-11 LAB — LIPID PANEL

## 2023-12-11 MED ORDER — METFORMIN HCL ER 750 MG PO TB24: 750.0000 mg | ORAL_TABLET | Freq: Two times a day (BID) | ORAL | 0 refills | Status: DC

## 2023-12-11 NOTE — Progress Notes (Signed)
 My last patient in the morning was really cool  Subjective:  Patient ID: George Kane,  male    DOB: 12-Jul-1971  Age: 53 y.o.    CC: Medical Management of Chronic Issues   HPI Errol Ala presents for  follow-up of hypertension. Patient has no history of headache chest pain or shortness of breath or recent cough. Patient also denies symptoms of TIA such as numbness weakness lateralizing. Patient denies side effects from medication. States taking it regularly.  Patient also  in for follow-up of elevated cholesterol. Doing well without complaints on current medication. Denies side effects  including myalgia and arthralgia and nausea. Also in today for liver function testing. Currently no chest pain, shortness of breath or other cardiovascular related symptoms noted.  Follow-up of diabetes. Patient does check blood sugar at home. Readings run between 110 and 140 Patient denies symptoms such as excessive hunger or urinary frequency, excessive hunger, nausea No significant hypoglycemic spells noted. Medications reviewed. Pt reports taking them regularly. Pt. denies complication/adverse reaction today.    History Jeramey has a past medical history of Diabetes mellitus without complication (HCC) and HLD (hyperlipidemia).   He has a past surgical history that includes Colonoscopy with propofol (N/A, 07/05/2023).   His family history includes Cancer in his mother; Healthy in his brother and sister.He reports that he has never smoked. He has never used smokeless tobacco. He reports current alcohol use. He reports that he does not use drugs.  Current Outpatient Medications on File Prior to Visit  Medication Sig Dispense Refill   atorvastatin (LIPITOR) 80 MG tablet Take 1 tablet (80 mg total) by mouth daily. 90 tablet 3   blood glucose meter kit and supplies KIT Dispense based on patient and insurance preference. Use up to four times daily as directed. (FOR ICD-10 : E11.9 1 each 11    dapagliflozin propanediol (FARXIGA) 10 MG TABS tablet Take 1 tablet (10 mg total) by mouth daily before breakfast. 90 tablet 3   Krill Oil 500 MG CAPS Take 500 mg by mouth.     Omega-3 Fatty Acids (FISH OIL CONCENTRATE PO) Take by mouth.     ONETOUCH VERIO test strip CHECK BLOOD SUGAR UP TO FOUR TIMES DAILY DX E11.9 400 strip 3   Vitamin D, Ergocalciferol, (DRISDOL) 1.25 MG (50000 UNIT) CAPS capsule Take 1 capsule (50,000 Units total) by mouth every 7 (seven) days. 13 capsule 1   No current facility-administered medications on file prior to visit.    ROS Review of Systems  Constitutional:  Negative for fever.  Respiratory:  Negative for shortness of breath.   Cardiovascular:  Negative for chest pain.  Musculoskeletal:  Negative for arthralgias.  Skin:  Negative for rash.    Objective:  BP 125/76   Pulse 98   Temp 97.7 F (36.5 C)   Ht 5\' 5"  (1.651 m)   Wt 152 lb (68.9 kg)   SpO2 98%   BMI 25.29 kg/m   BP Readings from Last 3 Encounters:  12/11/23 125/76  08/15/23 108/65  07/05/23 111/70    Wt Readings from Last 3 Encounters:  12/11/23 152 lb (68.9 kg)  08/15/23 150 lb 9.6 oz (68.3 kg)  07/05/23 146 lb (66.2 kg)     Physical Exam Vitals reviewed.  Constitutional:      Appearance: He is well-developed.  HENT:     Head: Normocephalic and atraumatic.     Right Ear: External ear normal.     Left Ear: External  ear normal.     Mouth/Throat:     Pharynx: No oropharyngeal exudate or posterior oropharyngeal erythema.  Eyes:     Pupils: Pupils are equal, round, and reactive to light.  Cardiovascular:     Rate and Rhythm: Normal rate and regular rhythm.     Heart sounds: No murmur heard. Pulmonary:     Effort: No respiratory distress.     Breath sounds: Normal breath sounds.  Musculoskeletal:     Cervical back: Normal range of motion and neck supple.  Neurological:     Mental Status: He is alert and oriented to person, place, and time.     Diabetic Foot Exam  - Simple   Simple Foot Form Diabetic Foot exam was performed with the following findings: Yes 12/11/2023 12:05 PM  Visual Inspection No deformities, no ulcerations, no other skin breakdown bilaterally: Yes Sensation Testing Intact to touch and monofilament testing bilaterally: Yes Pulse Check Posterior Tibialis and Dorsalis pulse intact bilaterally: Yes Comments     Lab Results  Component Value Date   HGBA1C 6.5 (H) 12/11/2023   HGBA1C 7.0 (H) 08/15/2023   HGBA1C 7.7 (H) 04/12/2023    Assessment & Plan:   Jaydis "Onalee Hua" was seen today for medical management of chronic issues.  Diagnoses and all orders for this visit:  Diabetic lipidosis (HCC)  Mixed hyperlipidemia  Other orders -     metFORMIN (GLUCOPHAGE-XR) 750 MG 24 hr tablet; Take 1 tablet (750 mg total) by mouth 2 (two) times daily. With breakfast and supper.   I am having Sailor Haughn. [name] "[name]" maintain his blood glucose meter kit and supplies, Omega-3 Fatty Acids (FISH OIL CONCENTRATE PO), Vitamin D (Ergocalciferol), OneTouch Verio, atorvastatin, dapagliflozin propanediol, Krill Oil, and metFORMIN.  Meds ordered this encounter  Medications   metFORMIN (GLUCOPHAGE-XR) 750 MG 24 hr tablet    Sig: Take 1 tablet (750 mg total) by mouth 2 (two) times daily. With breakfast and supper.    Dispense:  180 tablet    Refill:  0     Follow-up: Return in about 3 months (around 03/12/2024).  [name], M.D.

## 2023-12-12 LAB — CBC WITH DIFFERENTIAL/PLATELET
Basophils Absolute: 0 10*3/uL (ref 0.0–0.2)
Basos: 0 %
EOS (ABSOLUTE): 0.1 10*3/uL (ref 0.0–0.4)
Eos: 2 %
Hematocrit: 44.9 % (ref 37.5–51.0)
Hemoglobin: 15.1 g/dL (ref 13.0–17.7)
Immature Grans (Abs): 0 10*3/uL (ref 0.0–0.1)
Immature Granulocytes: 0 %
Lymphocytes Absolute: 2.5 10*3/uL (ref 0.7–3.1)
Lymphs: 39 %
MCH: 27.7 pg (ref 26.6–33.0)
MCHC: 33.6 g/dL (ref 31.5–35.7)
MCV: 82 fL (ref 79–97)
Monocytes Absolute: 0.4 10*3/uL (ref 0.1–0.9)
Monocytes: 6 %
Neutrophils Absolute: 3.4 10*3/uL (ref 1.4–7.0)
Neutrophils: 53 %
Platelets: 280 10*3/uL (ref 150–450)
RBC: 5.45 x10E6/uL (ref 4.14–5.80)
RDW: 13.6 % (ref 11.6–15.4)
WBC: 6.4 10*3/uL (ref 3.4–10.8)

## 2023-12-12 LAB — CMP14+EGFR
ALT: 38 IU/L (ref 0–44)
AST: 31 IU/L (ref 0–40)
Albumin: 4.7 g/dL (ref 4.1–5.1)
Alkaline Phosphatase: 112 IU/L (ref 44–121)
BUN/Creatinine Ratio: 15 (ref 9–20)
BUN: 13 mg/dL (ref 6–24)
Bilirubin Total: 0.6 mg/dL (ref 0.0–1.2)
CO2: 24 mmol/L (ref 20–29)
Calcium: 9.5 mg/dL (ref 8.7–10.2)
Chloride: 102 mmol/L (ref 96–106)
Creatinine, Ser: 0.88 mg/dL (ref 0.76–1.27)
Globulin, Total: 2.4 g/dL (ref 1.5–4.5)
Glucose: 118 mg/dL — ABNORMAL HIGH (ref 70–99)
Potassium: 3.9 mmol/L (ref 3.5–5.2)
Sodium: 141 mmol/L (ref 134–144)
Total Protein: 7.1 g/dL (ref 6.0–8.5)
eGFR: 105 mL/min/{1.73_m2} (ref >59)

## 2023-12-12 LAB — LIPID PANEL
Cholesterol, Total: 129 mg/dL (ref 100–199)
HDL: 43 mg/dL (ref >39)
LDL CALC COMMENT:: 3 ratio (ref 0.0–5.0)
LDL Chol Calc (NIH): 60 mg/dL (ref 0–99)
Triglycerides: 152 mg/dL — ABNORMAL HIGH (ref 0–149)
VLDL Cholesterol Cal: 26 mg/dL (ref 5–40)

## 2023-12-12 NOTE — Progress Notes (Signed)
Hello George Kane,  Your lab result is normal and/or stable.Some minor variations that are not significant are commonly marked abnormal, but do not represent any medical problem for you.  Best regards, Lanice Folden, M.D.

## 2024-03-12 ENCOUNTER — Ambulatory Visit: Admitting: Family Medicine

## 2024-03-12 ENCOUNTER — Telehealth: Payer: Self-pay

## 2024-03-12 NOTE — Telephone Encounter (Signed)
 Lm on vm to reschedule due to provider call out.

## 2024-04-04 ENCOUNTER — Telehealth: Payer: Self-pay | Admitting: Family Medicine

## 2024-04-04 MED ORDER — DAPAGLIFLOZIN PROPANEDIOL 10 MG PO TABS: 10.0000 mg | ORAL_TABLET | Freq: Every day | ORAL | 0 refills | Status: DC

## 2024-04-04 NOTE — Telephone Encounter (Signed)
 Pt aware refill sent to pharmacy

## 2024-04-04 NOTE — Telephone Encounter (Signed)
  Prescription Request  04/04/2024  Is this a Controlled Substance medicine? NO  Have you seen your PCP in the last 2 weeks? NO  If YES, route message to pool  -  If NO, patient needs to be scheduled for appointment.  What is the name of the medication or equipment? Farxiga  10 mg - Patient has been out for a week. Has appt in August with [facility].  Have you contacted your pharmacy to request a refill? NO   Which [facility] [place]   Patient notified that their request is being sent to the clinical staff for review and that they should receive a response within 2 business days.

## 2024-05-08 ENCOUNTER — Encounter: Payer: Self-pay | Admitting: Family Medicine

## 2024-05-08 ENCOUNTER — Ambulatory Visit: Admitting: Family Medicine

## 2024-05-08 VITALS — BP 123/83 | HR 75 | Temp 97.9°F | Ht 65.0 in | Wt 150.2 lb

## 2024-05-08 DIAGNOSIS — Z7984 Long term (current) use of oral hypoglycemic drugs: Secondary | ICD-10-CM

## 2024-05-08 DIAGNOSIS — E119 Type 2 diabetes mellitus without complications: Secondary | ICD-10-CM | POA: Diagnosis not present

## 2024-05-08 DIAGNOSIS — Z23 Encounter for immunization: Secondary | ICD-10-CM

## 2024-05-08 DIAGNOSIS — E782 Mixed hyperlipidemia: Secondary | ICD-10-CM

## 2024-05-08 DIAGNOSIS — R002 Palpitations: Secondary | ICD-10-CM

## 2024-05-08 LAB — BAYER DCA HB A1C WAIVED: HB A1C (BAYER DCA - WAIVED): 6.9 % — ABNORMAL HIGH (ref 4.8–5.6)

## 2024-05-08 MED ORDER — DAPAGLIFLOZIN PROPANEDIOL 5 MG PO TABS: 10.0000 mg | ORAL_TABLET | Freq: Every day | ORAL | 3 refills | Status: AC

## 2024-05-08 NOTE — Progress Notes (Signed)
 Subjective:  Patient ID: George Kane, male    DOB: [birth year]  Age: 53 y.o. MRN: [id number]  CC: Medical Management of Chronic Issues   HPI  Discussed the use of AI scribe software for clinical note transcription with the patient, who gave verbal consent to proceed.  History of Present Illness   George Kane is a 53 year old male with diabetes who presents with nocturnal palpitations.  He experiences episodes of waking up at night with a sensation of his heart beating faster, similar to the feeling after running. These episodes occur intermittently and resolve by the morning after he returns to sleep. No chest pain or shortness of breath during the palpitations, but he does report sweating.  He has not checked his blood sugar during these episodes as he occurs in the middle of the night. However, he monitors his blood sugar twice daily, with morning readings around 112 mg/dL and evening readings around 130 mg/dL. He is currently taking Farxiga and metformin for his diabetes management.  He maintains a diet low in carbohydrates and is satisfied with his weight, which he keeps between 150 and 155 pounds. He consumes wheat bread but tries to limit his intake of other high-carbohydrate foods.             05/08/2024    8:19 AM 05/08/2024    8:01 AM 12/11/2023   11:15 AM  Depression screen PHQ 2/9  Decreased Interest 0 0 0  Down, Depressed, Hopeless 0 0 0  PHQ - 2 Score 0 0 0  Altered sleeping 1  0  Tired, decreased energy 1  0  Change in appetite 0  0  Feeling bad or failure about yourself  0  0  Trouble concentrating 0  0  Moving slowly or fidgety/restless 0  0  Suicidal thoughts 0  0  PHQ-9 Score 2  0  Difficult doing work/chores Not difficult at all  Not difficult at all    History Taras has a past medical history of Diabetes mellitus without complication (HCC) and HLD (hyperlipidemia).   He has a past surgical history that includes Colonoscopy  with propofol (N/A, 07/05/2023).   His family history includes Cancer in his mother; Healthy in his brother and sister.He reports that he has never smoked. He has never used smokeless tobacco. He reports current alcohol use. He reports that he does not use drugs.    ROS Review of Systems  Constitutional:  Negative for fever.  Respiratory:  Negative for shortness of breath.   Cardiovascular:  Negative for chest pain.  Endocrine: Negative.   Musculoskeletal:  Negative for arthralgias.  Skin:  Negative for rash.    Objective:  BP 123/83   Pulse 75   Temp 97.9 F (36.6 C)   Ht 5' 5 (1.651 m)   Wt 150 lb 3.2 oz (68.1 kg)   SpO2 97%   BMI 24.99 kg/m   BP Readings from Last 3 Encounters:  05/08/24 123/83  12/11/23 125/76  08/15/23 108/65    Wt Readings from Last 3 Encounters:  05/08/24 150 lb 3.2 oz (68.1 kg)  12/11/23 152 lb (68.9 kg)  08/15/23 150 lb 9.6 oz (68.3 kg)     Physical Exam Vitals reviewed.  Constitutional:      Appearance: He is well-developed.  HENT:     Head: Normocephalic and atraumatic.     Right Ear: External ear normal.     Left Ear: External ear normal.  Mouth/Throat:     Pharynx: No oropharyngeal exudate or posterior oropharyngeal erythema.  Eyes:     Pupils: Pupils are equal, round, and reactive to light.  Cardiovascular:     Rate and Rhythm: Normal rate and regular rhythm.     Heart sounds: No murmur heard. Pulmonary:     Effort: No respiratory distress.     Breath sounds: Normal breath sounds.  Musculoskeletal:     Cervical back: Normal range of motion and neck supple.  Neurological:     Mental Status: He is alert and oriented to person, place, and time.      Assessment & Plan:  Diabetes mellitus without complication (HCC) -     Microalbumin / creatinine urine ratio -     Bayer DCA Hb A1c Waived -     CBC with Differential/Platelet -     CMP14+EGFR  Mixed hyperlipidemia -     Lipid panel  Palpitations -     EKG  12-Lead  Other orders -     Dapagliflozin Propanediol; Take 2 tablets (10 mg total) by mouth daily before breakfast.  Dispense: 90 tablet; Refill: 3    Assessment and Plan    Palpitations with nocturnal sweating Intermittent nocturnal palpitations with sweating, no chest pain or dyspnea. Differential includes nocturnal hypoglycemia. EKG shows normal sinus rhythm. Sweating possibly related to hypoglycemia. - Order EKG. - Reduce dapagliflozin dosage. - Monitor blood glucose closely. - Evaluate A1c for potential rebound.  Type 2 diabetes mellitus Well-controlled with Farxiga and metformin. Morning blood glucose at 112 mg/dL, evening at 869 mg/dL, J8r at 3.0%. - Continue metformin. - Educated on dietary management, emphasizing low carbohydrate intake and portion control. - Monitor blood glucose levels regularly.          Follow-up: No follow-ups on file.  Butler Der, M.D.

## 2024-05-08 NOTE — Addendum Note (Signed)
 Addended by: JODENE AMI NOVAK on: 05/08/2024 09:49 AM   Modules accepted: Orders

## 2024-05-09 LAB — LIPID PANEL
Chol/HDL Ratio: 3.6 ratio (ref 0.0–5.0)
Cholesterol, Total: 153 mg/dL (ref 100–199)
HDL: 42 mg/dL (ref >39)
LDL Chol Calc (NIH): 64 mg/dL (ref 0–99)
Triglycerides: 297 mg/dL — ABNORMAL HIGH (ref 0–149)
VLDL Cholesterol Cal: 47 mg/dL — ABNORMAL HIGH (ref 5–40)

## 2024-05-09 LAB — CBC WITH DIFFERENTIAL/PLATELET
Basophils Absolute: 0 x10E3/uL (ref 0.0–0.2)
Basos: 0 %
EOS (ABSOLUTE): 0.1 x10E3/uL (ref 0.0–0.4)
Eos: 1 %
Hematocrit: 48 % (ref 37.5–51.0)
Hemoglobin: 15.2 g/dL (ref 13.0–17.7)
Immature Grans (Abs): 0 x10E3/uL (ref 0.0–0.1)
Immature Granulocytes: 0 %
Lymphocytes Absolute: 2.3 x10E3/uL (ref 0.7–3.1)
Lymphs: 34 %
MCH: 27.1 pg (ref 26.6–33.0)
MCHC: 31.7 g/dL (ref 31.5–35.7)
MCV: 86 fL (ref 79–97)
Monocytes Absolute: 0.4 x10E3/uL (ref 0.1–0.9)
Monocytes: 6 %
Neutrophils Absolute: 3.8 x10E3/uL (ref 1.4–7.0)
Neutrophils: 59 %
Platelets: 315 x10E3/uL (ref 150–450)
RBC: 5.61 x10E6/uL (ref 4.14–5.80)
RDW: 14 % (ref 11.6–15.4)
WBC: 6.6 x10E3/uL (ref 3.4–10.8)

## 2024-05-09 LAB — CMP14+EGFR
ALT: 46 IU/L — ABNORMAL HIGH (ref 0–44)
AST: 36 IU/L (ref 0–40)
Albumin: 4.8 g/dL (ref 4.1–5.1)
Alkaline Phosphatase: 115 IU/L (ref 44–121)
BUN/Creatinine Ratio: 14 (ref 9–20)
BUN: 12 mg/dL (ref 6–24)
Bilirubin Total: 0.6 mg/dL (ref 0.0–1.2)
CO2: 25 mmol/L (ref 20–29)
Calcium: 9.6 mg/dL (ref 8.7–10.2)
Chloride: 100 mmol/L (ref 96–106)
Creatinine, Ser: 0.88 mg/dL (ref 0.76–1.27)
Globulin, Total: 2.8 g/dL (ref 1.5–4.5)
Glucose: 129 mg/dL — ABNORMAL HIGH (ref 70–99)
Potassium: 4 mmol/L (ref 3.5–5.2)
Sodium: 142 mmol/L (ref 134–144)
Total Protein: 7.6 g/dL (ref 6.0–8.5)
eGFR: 105 mL/min/1.73 (ref >59)

## 2024-05-10 LAB — MICROALBUMIN / CREATININE URINE RATIO
Creatinine, Urine: 164.3 mg/dL
Microalb/Creat Ratio: 10 mg/g{creat} (ref 0–29)
Microalbumin, Urine: 15.9 ug/mL

## 2024-05-11 ENCOUNTER — Ambulatory Visit: Payer: Self-pay | Admitting: Family Medicine

## 2024-05-11 NOTE — Progress Notes (Signed)
Hello Averi,  Your lab result is normal and/or stable.Some minor variations that are not significant are commonly marked abnormal, but do not represent any medical problem for you.  Best regards, Lanice Folden, M.D.

## 2024-06-03 ENCOUNTER — Other Ambulatory Visit: Payer: Self-pay | Admitting: Family Medicine

## 2024-07-18 ENCOUNTER — Encounter: Payer: Self-pay | Admitting: *Deleted

## 2024-08-13 ENCOUNTER — Ambulatory Visit: Admitting: Family Medicine

## 2024-08-30 ENCOUNTER — Other Ambulatory Visit: Payer: Self-pay | Admitting: Family Medicine

## 2024-09-30 ENCOUNTER — Ambulatory Visit: Admitting: Family Medicine

## 2024-10-09 ENCOUNTER — Ambulatory Visit: Admitting: Family Medicine

## 2024-11-11 ENCOUNTER — Ambulatory Visit: Admitting: Family Medicine

## 2024-11-12 ENCOUNTER — Ambulatory Visit: Payer: Self-pay | Admitting: Family Medicine
